# Patient Record
Sex: Male | Born: 1960 | Race: White | Hispanic: No | Marital: Married | Smoking: Never smoker
Health system: Southern US, Community
[De-identification: ages and names within clinical notes are randomized; demographics above are authoritative.]

## PROBLEM LIST (undated history)

## (undated) DIAGNOSIS — I6521 Occlusion and stenosis of right carotid artery: Secondary | ICD-10-CM

## (undated) DIAGNOSIS — Z973 Presence of spectacles and contact lenses: Secondary | ICD-10-CM

## (undated) DIAGNOSIS — E785 Hyperlipidemia, unspecified: Secondary | ICD-10-CM

## (undated) DIAGNOSIS — K219 Gastro-esophageal reflux disease without esophagitis: Secondary | ICD-10-CM

## (undated) DIAGNOSIS — C4491 Basal cell carcinoma of skin, unspecified: Secondary | ICD-10-CM

## (undated) DIAGNOSIS — I10 Essential (primary) hypertension: Secondary | ICD-10-CM

## (undated) DIAGNOSIS — I2510 Atherosclerotic heart disease of native coronary artery without angina pectoris: Secondary | ICD-10-CM

## (undated) HISTORY — DX: Atherosclerotic heart disease of native coronary artery without angina pectoris: I25.10

## (undated) HISTORY — DX: Basal cell carcinoma of skin, unspecified: C44.91

## (undated) HISTORY — DX: Gastro-esophageal reflux disease without esophagitis: K21.9

## (undated) HISTORY — PX: COLONOSCOPY: SHX174

## (undated) HISTORY — DX: Essential (primary) hypertension: I10

## (undated) HISTORY — DX: Hyperlipidemia, unspecified: E78.5

## (undated) HISTORY — PX: WISDOM TOOTH EXTRACTION: SHX21

---
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
## 1999-06-17 HISTORY — PX: KNEE ARTHROSCOPY: SUR90

## 2000-05-14 ENCOUNTER — Ambulatory Visit (HOSPITAL_BASED_OUTPATIENT_CLINIC_OR_DEPARTMENT_OTHER): Admission: RE | Admit: 2000-05-14 | Discharge: 2000-05-14 | Payer: Self-pay | Admitting: Orthopedic Surgery

## 2005-02-01 ENCOUNTER — Emergency Department (HOSPITAL_COMMUNITY): Admission: EM | Admit: 2005-02-01 | Discharge: 2005-02-01 | Payer: Self-pay | Admitting: Emergency Medicine

## 2009-06-05 ENCOUNTER — Ambulatory Visit (HOSPITAL_COMMUNITY): Admission: RE | Admit: 2009-06-05 | Discharge: 2009-06-05 | Payer: Self-pay | Admitting: Gastroenterology

## 2010-01-03 ENCOUNTER — Encounter: Admission: RE | Admit: 2010-01-03 | Discharge: 2010-01-03 | Payer: Self-pay | Admitting: Thoracic Surgery

## 2010-11-01 NOTE — Op Note (Signed)
[facility]  Patient:    Anthony Medina MDJaryd, Drew                     MRN: [id number] Proc. Date: 05/14/00 Adm. Date:  [id number] Disc. 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 Intra-articular meniscal cyst anterior aspect lateral meniscus.  OPERATION: 1. Left knee exam under anesthesia. 2. Arthroscopy with excision of lateral meniscal cyst. 3. Curettage of region of osteochondritis dissecans with pinning of    osteochondral fragment with bioabsorbable pins times four.  SURGEON:  Loreta Ave, M.D.  ASSISTANT:  Arlys John D. Petrarca, P.A.-C.  ANESTHESIA:  Local with IV sedation.  SPECIMENS:  None.  CULTURES:  None.  COMPLICATIONS:  None.  DRESSINGS:  Soft compressive.  DESCRIPTION OF PROCEDURE:  The patient was brought to the operating room and placed on the operating table in the supine position.  After adequate anesthesia had been obtained, the left knee examined.  Full motion and good stability.  Leg holder applied.  Leg prepped and draped in the usual sterile fashion.  Three portals created, one superolateral, one each medial and lateral parapatellar.  Inflow catheter introduced.  The knee distended.  The arthroscope introduced.  Knee inspected.  Articular cartilage intact except for the lateral compartment.  Good patella femoral tracking.  Medial meniscus without tears.  Cruciate ligament is intact.  Meniscal cyst off the anterior aspect of the lateral meniscus debrided out.  The roughening of the meniscus debrided as well.  Unstable osteochondral fragment half the tibial plateau. This had a partial cartilaginous connection but it was  acting as a trap door. This was elevated and the sclerotic bulb below was treated with multiple microfracturing to ensure better blood supply.  All debrided loose bodies of chondral material from below the fragment debrided.  Utilizing the three portals with the addition of another medial portal, parapatella and slightly superior the fragment was reduced back to an anatomic position and then fixed with four bioabsorbable Orthosorb 1.3 mm pins placed in divergent directions through the fragment into the tibia below.  Good fixation of the fragment itself up.  The pins were countered sunk.  Fragment was no longer mobile. Instruments and fluid removed.  The entire knee had been examined prior to this.  No other findings were appreciated.  The knee and portals were injected with Marcaine.  Portals were closed with nylon.  Sterile compressive dressing applied.  Anesthesia reversed.  He was brought to the recovery room.  He tolerated surgery well with no complications. DD:  05/18/00 TD:  05/19/00 Job: [id number]

## 2010-12-27 ENCOUNTER — Other Ambulatory Visit (HOSPITAL_COMMUNITY): Payer: Self-pay | Admitting: Neurological Surgery

## 2010-12-27 ENCOUNTER — Ambulatory Visit (HOSPITAL_COMMUNITY)
Admission: RE | Admit: 2010-12-27 | Discharge: 2010-12-27 | Disposition: A | Discharge status: Home / Self Care | Payer: Commercial Managed Care - PPO | Source: Ambulatory Visit | Attending: Neurological Surgery | Admitting: Neurological Surgery

## 2010-12-27 DIAGNOSIS — M542 Cervicalgia: Secondary | ICD-10-CM | POA: Insufficient documentation

## 2010-12-27 DIAGNOSIS — M5412 Radiculopathy, cervical region: Secondary | ICD-10-CM

## 2010-12-27 DIAGNOSIS — M5020 Other cervical disc displacement, unspecified cervical region: Secondary | ICD-10-CM | POA: Insufficient documentation

## 2010-12-27 DIAGNOSIS — R209 Unspecified disturbances of skin sensation: Secondary | ICD-10-CM | POA: Insufficient documentation

## 2010-12-27 DIAGNOSIS — M47812 Spondylosis without myelopathy or radiculopathy, cervical region: Secondary | ICD-10-CM | POA: Insufficient documentation

## 2012-10-20 ENCOUNTER — Other Ambulatory Visit: Payer: Self-pay | Admitting: Orthopedic Surgery

## 2012-10-20 DIAGNOSIS — M25551 Pain in right hip: Secondary | ICD-10-CM

## 2012-10-22 ENCOUNTER — Ambulatory Visit
Admission: RE | Admit: 2012-10-22 | Discharge: 2012-10-22 | Disposition: A | Discharge status: Home / Self Care | Payer: Commercial Managed Care - PPO | Source: Ambulatory Visit | Attending: Orthopedic Surgery | Admitting: Orthopedic Surgery

## 2012-10-22 DIAGNOSIS — M25551 Pain in right hip: Secondary | ICD-10-CM

## 2012-10-22 MED ORDER — IOHEXOL 180 MG/ML  SOLN
1.0000 mL | Freq: Once | INTRAMUSCULAR | Status: AC | PRN
  Administered 2012-10-22: 1 mL via INTRA_ARTICULAR

## 2012-10-22 MED ORDER — METHYLPREDNISOLONE ACETATE 40 MG/ML INJ SUSP (RADIOLOG
120.0000 mg | Freq: Once | INTRAMUSCULAR | Status: AC
  Administered 2012-10-22: 120 mg via INTRA_ARTICULAR

## 2013-02-08 ENCOUNTER — Telehealth: Payer: Self-pay | Admitting: Licensed Clinical Social Worker

## 2013-02-08 NOTE — Telephone Encounter (Signed)
I called and asked patient's wife to call back and schedule a lab appointment for blood cultures per Dr. Daiva Eves. 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

## 2013-03-09 ENCOUNTER — Ambulatory Visit
Admission: RE | Admit: 2013-03-09 | Discharge: 2013-03-09 | Disposition: A | Discharge status: Home / Self Care | Payer: Commercial Managed Care - PPO | Source: Ambulatory Visit | Attending: Internal Medicine | Admitting: Internal Medicine

## 2013-03-09 ENCOUNTER — Other Ambulatory Visit: Payer: Self-pay | Admitting: Internal Medicine

## 2013-03-09 DIAGNOSIS — Z01818 Encounter for other preprocedural examination: Secondary | ICD-10-CM

## 2013-04-27 ENCOUNTER — Encounter (HOSPITAL_COMMUNITY): Admission: RE | Payer: Self-pay | Source: Ambulatory Visit

## 2013-04-27 ENCOUNTER — Inpatient Hospital Stay (HOSPITAL_COMMUNITY)
Admission: RE | Admit: 2013-04-27 | Payer: Commercial Managed Care - PPO | Source: Ambulatory Visit | Admitting: Orthopedic Surgery

## 2013-04-27 SURGERY — ARTHROPLASTY, HIP, TOTAL, ANTERIOR APPROACH
Anesthesia: Choice | Laterality: Right

## 2013-05-08 HISTORY — PX: HIP RESURFACING: SHX1760

## 2013-06-07 ENCOUNTER — Other Ambulatory Visit: Payer: Self-pay | Admitting: Orthopedic Surgery

## 2013-06-07 ENCOUNTER — Ambulatory Visit
Admission: RE | Admit: 2013-06-07 | Discharge: 2013-06-07 | Disposition: A | Discharge status: Home / Self Care | Payer: 59 | Source: Ambulatory Visit | Attending: Orthopedic Surgery | Admitting: Orthopedic Surgery

## 2013-06-07 DIAGNOSIS — M1610 Unilateral primary osteoarthritis, unspecified hip: Secondary | ICD-10-CM

## 2013-06-07 DIAGNOSIS — M169 Osteoarthritis of hip, unspecified: Secondary | ICD-10-CM

## 2014-03-08 ENCOUNTER — Other Ambulatory Visit: Payer: Self-pay | Admitting: Internal Medicine

## 2014-03-08 DIAGNOSIS — R079 Chest pain, unspecified: Secondary | ICD-10-CM

## 2014-03-09 ENCOUNTER — Ambulatory Visit
Admission: RE | Admit: 2014-03-09 | Discharge: 2014-03-09 | Disposition: A | Discharge status: Home / Self Care | Payer: 59 | Source: Ambulatory Visit | Attending: Internal Medicine | Admitting: Internal Medicine

## 2014-03-09 DIAGNOSIS — R079 Chest pain, unspecified: Secondary | ICD-10-CM

## 2014-03-31 ENCOUNTER — Ambulatory Visit
Admission: RE | Admit: 2014-03-31 | Discharge: 2014-03-31 | Disposition: A | Discharge status: Home / Self Care | Payer: 59 | Source: Ambulatory Visit | Attending: Orthopedic Surgery | Admitting: Orthopedic Surgery

## 2014-03-31 ENCOUNTER — Other Ambulatory Visit: Payer: Self-pay | Admitting: Orthopedic Surgery

## 2014-03-31 DIAGNOSIS — M25551 Pain in right hip: Secondary | ICD-10-CM

## 2015-05-11 ENCOUNTER — Telehealth: Payer: Self-pay | Admitting: Cardiothoracic Surgery

## 2015-05-11 MED ORDER — AMOXICILLIN 500 MG PO CAPS: 500.0000 mg | ORAL_CAPSULE | Freq: Once | ORAL | Status: DC

## 2015-05-11 NOTE — Telephone Encounter (Signed)
Patient well known to me with implanted  prosthetic material needing amoxicillin for dental appointment, Dental office unable to fill. amoxicillin 500 mg one hour before dental appointment sent to pharmacy Grace Isaac MD      [address] [place] [address] Office [phone]   [place]

## 2015-07-03 DIAGNOSIS — S81851A Open bite, right lower leg, initial encounter: Secondary | ICD-10-CM | POA: Diagnosis not present

## 2015-07-03 DIAGNOSIS — W540XXA Bitten by dog, initial encounter: Secondary | ICD-10-CM | POA: Diagnosis not present

## 2015-07-03 DIAGNOSIS — Z23 Encounter for immunization: Secondary | ICD-10-CM | POA: Diagnosis not present

## 2015-07-09 DIAGNOSIS — E538 Deficiency of other specified B group vitamins: Secondary | ICD-10-CM | POA: Diagnosis not present

## 2016-01-09 DIAGNOSIS — F4320 Adjustment disorder, unspecified: Secondary | ICD-10-CM | POA: Diagnosis not present

## 2016-03-19 DIAGNOSIS — F4320 Adjustment disorder, unspecified: Secondary | ICD-10-CM | POA: Diagnosis not present

## 2016-05-12 DIAGNOSIS — E538 Deficiency of other specified B group vitamins: Secondary | ICD-10-CM | POA: Diagnosis not present

## 2016-05-12 DIAGNOSIS — E7800 Pure hypercholesterolemia, unspecified: Secondary | ICD-10-CM | POA: Diagnosis not present

## 2016-05-12 DIAGNOSIS — Z0000 Encounter for general adult medical examination without abnormal findings: Secondary | ICD-10-CM | POA: Diagnosis not present

## 2016-05-12 DIAGNOSIS — K219 Gastro-esophageal reflux disease without esophagitis: Secondary | ICD-10-CM | POA: Diagnosis not present

## 2016-05-12 DIAGNOSIS — R030 Elevated blood-pressure reading, without diagnosis of hypertension: Secondary | ICD-10-CM | POA: Diagnosis not present

## 2016-07-21 DIAGNOSIS — Z85828 Personal history of other malignant neoplasm of skin: Secondary | ICD-10-CM | POA: Diagnosis not present

## 2016-07-21 DIAGNOSIS — D2272 Melanocytic nevi of left lower limb, including hip: Secondary | ICD-10-CM | POA: Diagnosis not present

## 2016-07-21 DIAGNOSIS — L814 Other melanin hyperpigmentation: Secondary | ICD-10-CM | POA: Diagnosis not present

## 2016-07-21 DIAGNOSIS — D1801 Hemangioma of skin and subcutaneous tissue: Secondary | ICD-10-CM | POA: Diagnosis not present

## 2016-07-21 DIAGNOSIS — D2271 Melanocytic nevi of right lower limb, including hip: Secondary | ICD-10-CM | POA: Diagnosis not present

## 2016-07-21 DIAGNOSIS — D225 Melanocytic nevi of trunk: Secondary | ICD-10-CM | POA: Diagnosis not present

## 2016-08-21 DIAGNOSIS — I10 Essential (primary) hypertension: Secondary | ICD-10-CM | POA: Diagnosis not present

## 2016-12-12 DIAGNOSIS — H52223 Regular astigmatism, bilateral: Secondary | ICD-10-CM | POA: Diagnosis not present

## 2016-12-12 DIAGNOSIS — H524 Presbyopia: Secondary | ICD-10-CM | POA: Diagnosis not present

## 2017-04-02 ENCOUNTER — Ambulatory Visit
Admission: RE | Admit: 2017-04-02 | Discharge: 2017-04-02 | Disposition: A | Discharge status: Home / Self Care | Payer: 59 | Source: Ambulatory Visit | Attending: Orthopedic Surgery | Admitting: Orthopedic Surgery

## 2017-04-02 ENCOUNTER — Other Ambulatory Visit: Payer: Self-pay | Admitting: Orthopedic Surgery

## 2017-04-02 DIAGNOSIS — Z96641 Presence of right artificial hip joint: Secondary | ICD-10-CM | POA: Diagnosis not present

## 2017-04-02 DIAGNOSIS — R52 Pain, unspecified: Secondary | ICD-10-CM

## 2017-04-02 DIAGNOSIS — Z471 Aftercare following joint replacement surgery: Secondary | ICD-10-CM | POA: Diagnosis not present

## 2017-05-18 DIAGNOSIS — I10 Essential (primary) hypertension: Secondary | ICD-10-CM | POA: Diagnosis not present

## 2017-05-18 DIAGNOSIS — Z125 Encounter for screening for malignant neoplasm of prostate: Secondary | ICD-10-CM | POA: Diagnosis not present

## 2017-05-18 DIAGNOSIS — Z0000 Encounter for general adult medical examination without abnormal findings: Secondary | ICD-10-CM | POA: Diagnosis not present

## 2017-05-18 DIAGNOSIS — E538 Deficiency of other specified B group vitamins: Secondary | ICD-10-CM | POA: Diagnosis not present

## 2017-05-18 DIAGNOSIS — Z96641 Presence of right artificial hip joint: Secondary | ICD-10-CM | POA: Diagnosis not present

## 2017-05-18 DIAGNOSIS — K219 Gastro-esophageal reflux disease without esophagitis: Secondary | ICD-10-CM | POA: Diagnosis not present

## 2017-05-18 DIAGNOSIS — E7800 Pure hypercholesterolemia, unspecified: Secondary | ICD-10-CM | POA: Diagnosis not present

## 2017-05-19 DIAGNOSIS — I10 Essential (primary) hypertension: Secondary | ICD-10-CM | POA: Diagnosis not present

## 2017-08-28 DIAGNOSIS — Z85828 Personal history of other malignant neoplasm of skin: Secondary | ICD-10-CM | POA: Diagnosis not present

## 2017-08-28 DIAGNOSIS — B353 Tinea pedis: Secondary | ICD-10-CM | POA: Diagnosis not present

## 2017-08-28 DIAGNOSIS — D485 Neoplasm of uncertain behavior of skin: Secondary | ICD-10-CM | POA: Diagnosis not present

## 2017-08-28 DIAGNOSIS — L814 Other melanin hyperpigmentation: Secondary | ICD-10-CM | POA: Diagnosis not present

## 2017-08-28 DIAGNOSIS — D2261 Melanocytic nevi of right upper limb, including shoulder: Secondary | ICD-10-CM | POA: Diagnosis not present

## 2017-08-28 DIAGNOSIS — D1801 Hemangioma of skin and subcutaneous tissue: Secondary | ICD-10-CM | POA: Diagnosis not present

## 2017-08-28 DIAGNOSIS — C44519 Basal cell carcinoma of skin of other part of trunk: Secondary | ICD-10-CM | POA: Diagnosis not present

## 2017-08-28 DIAGNOSIS — L570 Actinic keratosis: Secondary | ICD-10-CM | POA: Diagnosis not present

## 2017-08-28 DIAGNOSIS — D225 Melanocytic nevi of trunk: Secondary | ICD-10-CM | POA: Diagnosis not present

## 2017-08-28 DIAGNOSIS — D2272 Melanocytic nevi of left lower limb, including hip: Secondary | ICD-10-CM | POA: Diagnosis not present

## 2017-12-25 DIAGNOSIS — H524 Presbyopia: Secondary | ICD-10-CM | POA: Diagnosis not present

## 2017-12-25 DIAGNOSIS — H52223 Regular astigmatism, bilateral: Secondary | ICD-10-CM | POA: Diagnosis not present

## 2018-05-31 DIAGNOSIS — E7800 Pure hypercholesterolemia, unspecified: Secondary | ICD-10-CM | POA: Diagnosis not present

## 2018-05-31 DIAGNOSIS — G453 Amaurosis fugax: Secondary | ICD-10-CM | POA: Diagnosis not present

## 2018-05-31 DIAGNOSIS — I7300 Raynaud's syndrome without gangrene: Secondary | ICD-10-CM | POA: Diagnosis not present

## 2018-05-31 DIAGNOSIS — Z0000 Encounter for general adult medical examination without abnormal findings: Secondary | ICD-10-CM | POA: Diagnosis not present

## 2018-05-31 DIAGNOSIS — K219 Gastro-esophageal reflux disease without esophagitis: Secondary | ICD-10-CM | POA: Diagnosis not present

## 2018-05-31 DIAGNOSIS — I10 Essential (primary) hypertension: Secondary | ICD-10-CM | POA: Diagnosis not present

## 2018-06-04 ENCOUNTER — Other Ambulatory Visit: Payer: Self-pay | Admitting: Vascular Surgery

## 2018-06-04 ENCOUNTER — Ambulatory Visit (HOSPITAL_COMMUNITY)
Admission: RE | Admit: 2018-06-04 | Discharge: 2018-06-04 | Disposition: A | Discharge status: Home / Self Care | Payer: 59 | Source: Ambulatory Visit | Attending: Family | Admitting: Family

## 2018-06-04 ENCOUNTER — Other Ambulatory Visit: Payer: Self-pay

## 2018-06-04 DIAGNOSIS — G453 Amaurosis fugax: Secondary | ICD-10-CM | POA: Diagnosis not present

## 2018-06-04 DIAGNOSIS — Z23 Encounter for immunization: Secondary | ICD-10-CM | POA: Diagnosis not present

## 2018-06-08 ENCOUNTER — Other Ambulatory Visit: Payer: Self-pay | Admitting: Internal Medicine

## 2018-06-08 DIAGNOSIS — I6521 Occlusion and stenosis of right carotid artery: Secondary | ICD-10-CM

## 2018-06-11 ENCOUNTER — Encounter: Payer: Self-pay | Admitting: Thoracic Surgery (Cardiothoracic Vascular Surgery)

## 2018-06-11 ENCOUNTER — Ambulatory Visit
Admission: RE | Admit: 2018-06-11 | Discharge: 2018-06-11 | Disposition: A | Discharge status: Home / Self Care | Payer: 59 | Source: Ambulatory Visit | Attending: Internal Medicine | Admitting: Internal Medicine

## 2018-06-11 DIAGNOSIS — I6521 Occlusion and stenosis of right carotid artery: Secondary | ICD-10-CM

## 2018-06-11 DIAGNOSIS — I6523 Occlusion and stenosis of bilateral carotid arteries: Secondary | ICD-10-CM | POA: Diagnosis not present

## 2018-06-11 MED ORDER — IOPAMIDOL (ISOVUE-370) INJECTION 76%
75.0000 mL | Freq: Once | INTRAVENOUS | Status: AC | PRN
  Administered 2018-06-11: 75 mL via INTRAVENOUS

## 2018-06-14 ENCOUNTER — Encounter: Payer: Self-pay | Admitting: Thoracic Surgery (Cardiothoracic Vascular Surgery)

## 2018-06-15 ENCOUNTER — Encounter: Payer: Self-pay | Admitting: Vascular Surgery

## 2018-06-15 ENCOUNTER — Ambulatory Visit (INDEPENDENT_AMBULATORY_CARE_PROVIDER_SITE_OTHER): Payer: 59 | Admitting: Vascular Surgery

## 2018-06-15 ENCOUNTER — Other Ambulatory Visit: Payer: Self-pay

## 2018-06-15 ENCOUNTER — Encounter (HOSPITAL_COMMUNITY): Payer: Self-pay

## 2018-06-15 ENCOUNTER — Encounter (HOSPITAL_COMMUNITY)
Admission: RE | Admit: 2018-06-15 | Discharge: 2018-06-15 | Disposition: A | Discharge status: Home / Self Care | Payer: 59 | Source: Ambulatory Visit | Attending: Vascular Surgery | Admitting: Vascular Surgery

## 2018-06-15 ENCOUNTER — Other Ambulatory Visit: Payer: Self-pay | Admitting: *Deleted

## 2018-06-15 VITALS — BP 136/84 | HR 54 | Resp 18 | Ht 69.0 in | Wt 171.0 lb

## 2018-06-15 DIAGNOSIS — G453 Amaurosis fugax: Secondary | ICD-10-CM | POA: Diagnosis not present

## 2018-06-15 DIAGNOSIS — K219 Gastro-esophageal reflux disease without esophagitis: Secondary | ICD-10-CM | POA: Diagnosis not present

## 2018-06-15 DIAGNOSIS — E7800 Pure hypercholesterolemia, unspecified: Secondary | ICD-10-CM

## 2018-06-15 DIAGNOSIS — E785 Hyperlipidemia, unspecified: Secondary | ICD-10-CM | POA: Diagnosis not present

## 2018-06-15 DIAGNOSIS — I10 Essential (primary) hypertension: Secondary | ICD-10-CM | POA: Insufficient documentation

## 2018-06-15 DIAGNOSIS — I6521 Occlusion and stenosis of right carotid artery: Secondary | ICD-10-CM

## 2018-06-15 DIAGNOSIS — Z7982 Long term (current) use of aspirin: Secondary | ICD-10-CM | POA: Diagnosis not present

## 2018-06-15 DIAGNOSIS — Z79899 Other long term (current) drug therapy: Secondary | ICD-10-CM | POA: Diagnosis not present

## 2018-06-15 DIAGNOSIS — R9431 Abnormal electrocardiogram [ECG] [EKG]: Secondary | ICD-10-CM

## 2018-06-15 DIAGNOSIS — R001 Bradycardia, unspecified: Secondary | ICD-10-CM | POA: Diagnosis not present

## 2018-06-15 DIAGNOSIS — Z01818 Encounter for other preprocedural examination: Secondary | ICD-10-CM | POA: Insufficient documentation

## 2018-06-15 DIAGNOSIS — Z85828 Personal history of other malignant neoplasm of skin: Secondary | ICD-10-CM | POA: Diagnosis not present

## 2018-06-15 HISTORY — DX: Presence of spectacles and contact lenses: Z97.3

## 2018-06-15 HISTORY — DX: Occlusion and stenosis of right carotid artery: I65.21

## 2018-06-15 LAB — COMPREHENSIVE METABOLIC PANEL
ALT: 31 U/L (ref 0–44)
AST: 41 U/L (ref 15–41)
Albumin: 3.8 g/dL (ref 3.5–5.0)
Alkaline Phosphatase: 28 U/L — ABNORMAL LOW (ref 38–126)
Anion gap: 9 (ref 5–15)
BUN: 16 mg/dL (ref 6–20)
CO2: 29 mmol/L (ref 22–32)
CREATININE: 1.13 mg/dL (ref 0.61–1.24)
Calcium: 9.1 mg/dL (ref 8.9–10.3)
Chloride: 101 mmol/L (ref 98–111)
GFR calc Af Amer: >60 mL/min (ref >60)
GFR calc non Af Amer: >60 mL/min (ref >60)
Glucose, Bld: 94 mg/dL (ref 70–99)
Potassium: 4.2 mmol/L (ref 3.5–5.1)
SODIUM: 139 mmol/L (ref 135–145)
Total Bilirubin: 0.9 mg/dL (ref 0.3–1.2)
Total Protein: 6.4 g/dL — ABNORMAL LOW (ref 6.5–8.1)

## 2018-06-15 LAB — URINALYSIS, ROUTINE W REFLEX MICROSCOPIC
Bilirubin Urine: NEGATIVE
Glucose, UA: NEGATIVE mg/dL
Hgb urine dipstick: NEGATIVE
Ketones, ur: NEGATIVE mg/dL
Leukocytes, UA: NEGATIVE
Nitrite: NEGATIVE
Protein, ur: NEGATIVE mg/dL
SPECIFIC GRAVITY, URINE: 1.009 (ref 1.005–1.030)
pH: 7 (ref 5.0–8.0)

## 2018-06-15 LAB — APTT: aPTT: 26 seconds (ref 24–36)

## 2018-06-15 LAB — CBC
HCT: 42.2 % (ref 39.0–52.0)
Hemoglobin: 13.6 g/dL (ref 13.0–17.0)
MCH: 32.5 pg (ref 26.0–34.0)
MCHC: 32.2 g/dL (ref 30.0–36.0)
MCV: 101 fL — ABNORMAL HIGH (ref 80.0–100.0)
Platelets: 201 10*3/uL (ref 150–400)
RBC: 4.18 MIL/uL — ABNORMAL LOW (ref 4.22–5.81)
RDW: 12.4 % (ref 11.5–15.5)
WBC: 4.2 10*3/uL (ref 4.0–10.5)
nRBC: 0 % (ref 0.0–0.2)

## 2018-06-15 LAB — PROTIME-INR
INR: 1
Prothrombin Time: 13.1 seconds (ref 11.4–15.2)

## 2018-06-15 LAB — ABO/RH: ABO/RH(D): O NEG

## 2018-06-15 LAB — SURGICAL PCR SCREEN
MRSA, PCR: NEGATIVE
Staphylococcus aureus: NEGATIVE

## 2018-06-15 MED ORDER — ROSUVASTATIN CALCIUM 40 MG PO TABS: 40.0000 mg | ORAL_TABLET | Freq: Every day | ORAL | 3 refills | Status: DC

## 2018-06-15 NOTE — H&P (View-Only) (Signed)
[facility] [place]  Patient name: Anthony Medina MRN: [id number] DOB: 1960-06-23 Sex: male  REASON FOR CONSULT: Evaluation of symptomatic right carotid stenosis  HPI: Anthony Medina is a 57 y.o. male, who is seen today for discussion of symptomatic right carotid stenosis.  He is here today with his wife.  He had 2 separate episodes of classic right eye amaurosis fugax 5 to 6 months ago.  He initiated aspirin therapy and has had no further episodes since then.  He specifically denies any prior episodes of a aphasia or focal neurologic deficit.  He has medical treatment for hypertension and hyperlipidemia but otherwise is in excellent health.  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  Outpatient carotid duplex revealed critical stenosis in his right internal carotid artery and he subsequently underwent CT angiogram for further evaluation  Past Medical History:  Diagnosis Date  . Cancer ([place])    basal cell carcinoma; skin  . Carotid stenosis, right   . Essential hypertension   . GERD (gastroesophageal reflux disease)   . Hyperlipidemia   . Wears glasses     Family History  Problem Relation Age of Onset  . Colon cancer Mother 41  . Alzheimer's disease Father 41  . Colon cancer Sister 70    SOCIAL HISTORY: Social History   Socioeconomic History  . Marital status: Married    Spouse name: Not on file  . Number of children: Not on file  . Years of education: Not on file  . Highest education level: Not on file  Occupational History  . Occupation: physician    Employer: [employer]: [employer]  Social Needs  . Financial resource strain: Not on file  . Food insecurity:    Worry: Not on file    Inability: Not on file  . Transportation needs:    Medical: Not on file    Non-medical: Not on file  Tobacco Use  . Smoking status: Never Smoker  . Smokeless tobacco: Never Used    Substance and Sexual Activity  . Alcohol use: Yes    Alcohol/week: 7.0 standard drinks    Types: 2 Glasses of wine, 5 Cans of beer per week    Comment:  1-2 daily  . Drug use: Never  . Sexual activity: Yes    Partners: Female    Birth control/protection: None  Lifestyle  . Physical activity:    Days per week: 6 days    Minutes per session: 90 min  . Stress: Not on file  Relationships  . Social connections:    Talks on phone: Not on file    Gets together: Not on file    Attends religious service: Not on file    Active member of club or organization: Not on file    Attends meetings of clubs or organizations: Not on file    Relationship status: Not on file  . Intimate partner violence:    Fear of current or ex partner: Not on file    Emotionally abused: Not on file    Physically abused: Not on file    Forced sexual activity: Not on file  Other Topics Concern  . Not on file  Social History Narrative  . Not on file    No Known Allergies  Current Outpatient Medications  Medication Sig Dispense Refill  . lisinopril (PRINIVIL,ZESTRIL) 10 MG tablet Take 10 mg by mouth daily.    Marland Kitchen  pantoprazole (PROTONIX) 40 MG tablet Take 40 mg by mouth daily.    . rosuvastatin (CRESTOR) 40 MG tablet Take 1 tablet (40 mg total) by mouth daily. 90 tablet 3  . sildenafil (VIAGRA) 50 MG tablet Take 50 mg by mouth daily as needed for erectile dysfunction.    . vitamin B-12 (CYANOCOBALAMIN) 1000 MCG tablet Take 1,000 mcg by mouth every 7 (seven) days.     Marland Kitchen acetaminophen (TYLENOL) 500 MG tablet Take 500-1,000 mg by mouth every 6 (six) hours as needed for headache.    Marland Kitchen aspirin 325 MG EC tablet Take 325 mg by mouth daily.    . naproxen sodium (ALEVE) 220 MG tablet Take 220-440 mg by mouth 2 (two) times daily as needed (mild pain).     No current facility-administered medications for this visit.     REVIEW OF SYSTEMS:  [X]  denotes positive finding, [ ]  denotes negative finding Cardiac  Comments:   Chest pain or chest pressure:    Shortness of breath upon exertion:    Short of breath when lying flat:    Irregular heart rhythm:        Vascular    Pain in calf, thigh, or hip brought on by ambulation:    Pain in feet at night that wakes you up from your sleep:     Blood clot in your veins:    Leg swelling:         Pulmonary    Oxygen at home:    Productive cough:     Wheezing:         Neurologic    Sudden weakness in arms or legs:     Sudden numbness in arms or legs:     Sudden onset of difficulty speaking or slurred speech:    Temporary loss of vision in one eye:  x   Problems with dizziness:         Gastrointestinal    Blood in stool:     Vomited blood:         Genitourinary    Burning when urinating:     Blood in urine:        Psychiatric    Major depression:         Hematologic    Bleeding problems:    Problems with blood clotting too easily:        Skin    Rashes or ulcers:        Constitutional    Fever or chills:      PHYSICAL EXAM: Vitals:   06/15/18 0821 06/15/18 0823  BP: 133/84 136/84  Pulse: (!) 54   Resp: 18   SpO2: 98%   Weight: 171 lb (77.6 kg)   Height: 5\' 9"  (1.753 m)     GENERAL: The patient is a well-nourished male, in no acute distress. The vital signs are documented above. CARDIOVASCULAR: Carotid arteries without bruits bilaterally.  2+ radial and 2+ dorsalis pedis pulses PULMONARY: There is good air exchange  ABDOMEN: Soft and non-tender  MUSCULOSKELETAL: There are no major deformities or cyanosis. NEUROLOGIC: No focal weakness or paresthesias are detected. SKIN: There are no ulcers or rashes noted. PSYCHIATRIC: The patient has a normal affect.  DATA:  Carotid duplex and CT angiogram were reviewed. CT angiogram reveals string sign in the right internal carotid artery with calcification and mixed plaque present.  There is calcification with no stenosis in the left internal carotid artery.  The right internal carotid artery  is  smaller than the left.  There is minimal atherosclerotic calcification in the aortic arch.  Does have mildly narrowing calcification at the origin of the left vertebral artery  MEDICAL ISSUES: Critical right carotid stenosis with 2 episodes of amaurosis fugax right eye.  Have recommended right carotid endarterectomy for reduction of stroke risk.  I explained the procedure in detail including the 1 to 2% risk of stroke the procedure.  Also the very slight risk of cranial nerve injury.  In the expected 1 night hospitalization.  We have scheduled surgery for 06/17/2018.   Rosetta Posner, MD FACS [facility] Tel [phone] Pager [phone]

## 2018-06-15 NOTE — Progress Notes (Signed)
Pt denies SOB and chest pain. Pt stated that he " sees Dr. Stanford Breed, " Cardiology. Pt denies having an EKG and chest x ray within the last year. Pt denies recent labs. Pt stated that his heart rate of 49 ( on EKG) is a " normal resting pulse."  Pt denies having an echo and cardiac cath but stated that a stress test was performed " 15 years ago. " Pt verbalized understanding of all pre-op instructions. Spoke with Karoline Caldwell, PA, Anesthesiology, to make aware of pt pulse rate of 49; no new orders.

## 2018-06-15 NOTE — Pre-Procedure Instructions (Signed)
   Anthony Medina  06/15/2018     [id number] [place], [place] - [place] [address] [place] [address] Phone: [phone] Fax: [phone]  [place], [place] - [address] [place] [address] Phone: [phone] Fax: [phone]   Your procedure is scheduled on Thursday, June 17, 2018  Report to [place] at 5:30 A.M.  Call this number if you have problems the morning of surgery:  [phone]   Remember:  Do not eat or drink after midnight Wednesday, June 16, 2018  Take these medicines the morning of surgery with A SIP OF WATER : fexofenadine (ALLEGRA), pantoprazole (PROTONIX), rosuvastatin (CRESTOR), fluticasone (FLONASE) 50 MCG/ACT nasal spray Stop taking vitamins, fish oil and herbal medications. Do not take any NSAIDs ie: Ibuprofen, Advil, Naproxen (Aleve), Motrin, BC and Goody Powder; stop now.   Do not wear jewelry, make-up or nail polish.  Do not wear lotions, powders, or perfumes, or deodorant.  Do not shave 48 hours prior to surgery.  Men may shave face and neck.  Do not bring valuables to the hospital.  [facility] is not responsible for any belongings or valuables.  Contacts, dentures or bridgework may not be worn into surgery.  Leave your suitcase in the car.  After surgery it may be brought to your room. Special instructions: See [facility] -Preparing for Surgery " sheet Please read over the following fact sheets that you were given. Pain Booklet, Coughing and Deep Breathing, MRSA Information and Surgical Site Infection Prevention

## 2018-06-15 NOTE — Progress Notes (Signed)
[facility] [place]  Patient name: Anthony Medina MRN: [id number] DOB: [birth year] Sex: male  REASON FOR CONSULT: Evaluation of symptomatic right carotid stenosis  HPI: Anthony Medina is a 57 y.o. male, who is seen today for discussion of symptomatic right carotid stenosis.  He is here today with his wife.  He had 2 separate episodes of classic right eye amaurosis fugax 5 to 6 months ago.  He initiated aspirin therapy and has had no further episodes since then.  He specifically denies any prior episodes of a aphasia or focal neurologic deficit.  He has medical treatment for hypertension and hyperlipidemia but otherwise is in excellent health.  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  Outpatient carotid duplex revealed critical stenosis in his right internal carotid artery and he subsequently underwent CT angiogram for further evaluation  Past Medical History:  Diagnosis Date  . Cancer ([place])    basal cell carcinoma; skin  . Carotid stenosis, right   . Essential hypertension   . GERD (gastroesophageal reflux disease)   . Hyperlipidemia   . Wears glasses     Family History  Problem Relation Age of Onset  . Colon cancer Mother 66  . Alzheimer's disease Father 48  . Colon cancer Sister 62    SOCIAL HISTORY: Social History   Socioeconomic History  . Marital status: Married    Spouse name: Not on file  . Number of children: Not on file  . Years of education: Not on file  . Highest education level: Not on file  Occupational History  . Occupation: physician    Employer: [employer]: [employer]  Social Needs  . Financial resource strain: Not on file  . Food insecurity:    Worry: Not on file    Inability: Not on file  . Transportation needs:    Medical: Not on file    Non-medical: Not on file  Tobacco Use  . Smoking status: Never Smoker  . Smokeless tobacco: Never Used    Substance and Sexual Activity  . Alcohol use: Yes    Alcohol/week: 7.0 standard drinks    Types: 2 Glasses of wine, 5 Cans of beer per week    Comment:  1-2 daily  . Drug use: Never  . Sexual activity: Yes    Partners: Female    Birth control/protection: None  Lifestyle  . Physical activity:    Days per week: 6 days    Minutes per session: 90 min  . Stress: Not on file  Relationships  . Social connections:    Talks on phone: Not on file    Gets together: Not on file    Attends religious service: Not on file    Active member of club or organization: Not on file    Attends meetings of clubs or organizations: Not on file    Relationship status: Not on file  . Intimate partner violence:    Fear of current or ex partner: Not on file    Emotionally abused: Not on file    Physically abused: Not on file    Forced sexual activity: Not on file  Other Topics Concern  . Not on file  Social History Narrative  . Not on file    No Known Allergies  Current Outpatient Medications  Medication Sig Dispense Refill  . lisinopril (PRINIVIL,ZESTRIL) 10 MG tablet Take 10 mg by mouth daily.    Marland Kitchen  pantoprazole (PROTONIX) 40 MG tablet Take 40 mg by mouth daily.    . rosuvastatin (CRESTOR) 40 MG tablet Take 1 tablet (40 mg total) by mouth daily. 90 tablet 3  . sildenafil (VIAGRA) 50 MG tablet Take 50 mg by mouth daily as needed for erectile dysfunction.    . vitamin B-12 (CYANOCOBALAMIN) 1000 MCG tablet Take 1,000 mcg by mouth every 7 (seven) days.     Marland Kitchen acetaminophen (TYLENOL) 500 MG tablet Take 500-1,000 mg by mouth every 6 (six) hours as needed for headache.    Marland Kitchen aspirin 325 MG EC tablet Take 325 mg by mouth daily.    . naproxen sodium (ALEVE) 220 MG tablet Take 220-440 mg by mouth 2 (two) times daily as needed (mild pain).     No current facility-administered medications for this visit.     REVIEW OF SYSTEMS:  [X]  denotes positive finding, [ ]  denotes negative finding Cardiac  Comments:   Chest pain or chest pressure:    Shortness of breath upon exertion:    Short of breath when lying flat:    Irregular heart rhythm:        Vascular    Pain in calf, thigh, or hip brought on by ambulation:    Pain in feet at night that wakes you up from your sleep:     Blood clot in your veins:    Leg swelling:         Pulmonary    Oxygen at home:    Productive cough:     Wheezing:         Neurologic    Sudden weakness in arms or legs:     Sudden numbness in arms or legs:     Sudden onset of difficulty speaking or slurred speech:    Temporary loss of vision in one eye:  x   Problems with dizziness:         Gastrointestinal    Blood in stool:     Vomited blood:         Genitourinary    Burning when urinating:     Blood in urine:        Psychiatric    Major depression:         Hematologic    Bleeding problems:    Problems with blood clotting too easily:        Skin    Rashes or ulcers:        Constitutional    Fever or chills:      PHYSICAL EXAM: Vitals:   06/15/18 0821 06/15/18 0823  BP: 133/84 136/84  Pulse: (!) 54   Resp: 18   SpO2: 98%   Weight: 171 lb (77.6 kg)   Height: 5\' 9"  (1.753 m)     GENERAL: The patient is a well-nourished male, in no acute distress. The vital signs are documented above. CARDIOVASCULAR: Carotid arteries without bruits bilaterally.  2+ radial and 2+ dorsalis pedis pulses PULMONARY: There is good air exchange  ABDOMEN: Soft and non-tender  MUSCULOSKELETAL: There are no major deformities or cyanosis. NEUROLOGIC: No focal weakness or paresthesias are detected. SKIN: There are no ulcers or rashes noted. PSYCHIATRIC: The patient has a normal affect.  DATA:  Carotid duplex and CT angiogram were reviewed. CT angiogram reveals string sign in the right internal carotid artery with calcification and mixed plaque present.  There is calcification with no stenosis in the left internal carotid artery.  The right internal carotid artery  is  smaller than the left.  There is minimal atherosclerotic calcification in the aortic arch.  Does have mildly narrowing calcification at the origin of the left vertebral artery  MEDICAL ISSUES: Critical right carotid stenosis with 2 episodes of amaurosis fugax right eye.  Have recommended right carotid endarterectomy for reduction of stroke risk.  I explained the procedure in detail including the 1 to 2% risk of stroke the procedure.  Also the very slight risk of cranial nerve injury.  In the expected 1 night hospitalization.  We have scheduled surgery for 06/17/2018.   Rosetta Posner, MD FACS [facility] Tel [phone] Pager [phone]

## 2018-06-15 NOTE — Pre-Procedure Instructions (Signed)
   Anthony Medina  06/15/2018     [id number] [place], [place] - [place] [address] [place] [address] Phone: [phone] Fax: [phone]  [place], [place] - [address] [place] [address] Phone: [phone] Fax: [phone]   Your procedure is scheduled on Thursday, June 17, 2018  Report to [place] at 5:30 A.M.  Call this number if you have problems the morning of surgery:  [phone]   Remember:  Do not eat or drink after midnight Wednesday, June 16, 2018  Take these medicines the morning of surgery with A SIP OF WATER : fexofenadine (ALLEGRA), pantoprazole (PROTONIX), fluticasone (FLONASE) 50 MCG/ACT nasal spray Stop taking vitamins, fish oil and herbal medications. Do not take any NSAIDs ie: Ibuprofen, Advil, Naproxen (Aleve), Motrin, BC and Goody Powder; stop now.   Do not wear jewelry, make-up or nail polish.  Do not wear lotions, powders, or perfumes, or deodorant.  Do not shave 48 hours prior to surgery.  Men may shave face and neck.  Do not bring valuables to the hospital.  [facility] is not responsible for any belongings or valuables.  Contacts, dentures or bridgework may not be worn into surgery.  Leave your suitcase in the car.  After surgery it may be brought to your room. Special instructions: See [facility] -Preparing for Surgery " sheet Please read over the following fact sheets that you were given. Pain Booklet, Coughing and Deep Breathing, MRSA Information and Surgical Site Infection Prevention

## 2018-06-15 NOTE — Progress Notes (Signed)
Pre-admission appointment today at 10 am. Arranged with Rolla Flatten. Patient to remain on ASA.

## 2018-06-16 ENCOUNTER — Encounter: Payer: Self-pay | Admitting: Thoracic Surgery (Cardiothoracic Vascular Surgery)

## 2018-06-16 DIAGNOSIS — I6521 Occlusion and stenosis of right carotid artery: Secondary | ICD-10-CM | POA: Diagnosis not present

## 2018-06-16 DIAGNOSIS — I10 Essential (primary) hypertension: Secondary | ICD-10-CM | POA: Diagnosis not present

## 2018-06-16 DIAGNOSIS — E785 Hyperlipidemia, unspecified: Secondary | ICD-10-CM | POA: Diagnosis not present

## 2018-06-16 DIAGNOSIS — K219 Gastro-esophageal reflux disease without esophagitis: Secondary | ICD-10-CM | POA: Diagnosis not present

## 2018-06-16 DIAGNOSIS — Z85828 Personal history of other malignant neoplasm of skin: Secondary | ICD-10-CM | POA: Diagnosis not present

## 2018-06-16 DIAGNOSIS — Z7982 Long term (current) use of aspirin: Secondary | ICD-10-CM | POA: Diagnosis not present

## 2018-06-16 DIAGNOSIS — R001 Bradycardia, unspecified: Secondary | ICD-10-CM | POA: Diagnosis not present

## 2018-06-16 DIAGNOSIS — Z79899 Other long term (current) drug therapy: Secondary | ICD-10-CM | POA: Diagnosis not present

## 2018-06-16 LAB — TYPE AND SCREEN
ABO/RH(D): O NEG
Antibody Screen: NEGATIVE

## 2018-06-16 NOTE — Anesthesia Preprocedure Evaluation (Addendum)
Anesthesia Evaluation  Patient identified by MRN, date of birth, ID band Patient awake    Reviewed: Allergy & Precautions, H&P , NPO status , Patient's Chart, lab work & pertinent test results  Airway Mallampati: II  TM Distance: >3 FB Neck ROM: Full    Dental no notable dental hx. (+) Teeth Intact, Dental Advisory Given   Pulmonary neg pulmonary ROS,    Pulmonary exam normal breath sounds clear to auscultation       Cardiovascular Exercise Tolerance: Good hypertension, Pt. on medications + Peripheral Vascular Disease   Rhythm:Regular Rate:Normal     Neuro/Psych negative neurological ROS  negative psych ROS   GI/Hepatic Neg liver ROS, GERD  Medicated and Controlled,  Endo/Other  negative endocrine ROS  Renal/GU negative Renal ROS  negative genitourinary   Musculoskeletal   Abdominal   Peds  Hematology negative hematology ROS (+)   Anesthesia Other Findings   Reproductive/Obstetrics negative OB ROS                            Anesthesia Physical Anesthesia Plan  ASA: II  Anesthesia Plan: General   Post-op Pain Management:    Induction: Intravenous  PONV Risk Score and Plan: 3 and Ondansetron, Dexamethasone and Midazolam  Airway Management Planned: Oral ETT  Additional Equipment: Arterial line  Intra-op Plan:   Post-operative Plan: Extubation in OR  Informed Consent: I have reviewed the patients History and Physical, chart, labs and discussed the procedure including the risks, benefits and alternatives for the proposed anesthesia with the patient or authorized representative who has indicated his/her understanding and acceptance.   Dental advisory given  Plan Discussed with: CRNA  Anesthesia Plan Comments:         Anesthesia Quick Evaluation

## 2018-06-17 ENCOUNTER — Encounter (HOSPITAL_COMMUNITY)
Admission: RE | Disposition: A | Discharge status: Home / Self Care | Payer: Self-pay | Source: Home / Self Care | Attending: Vascular Surgery

## 2018-06-17 ENCOUNTER — Other Ambulatory Visit: Payer: Self-pay

## 2018-06-17 ENCOUNTER — Telehealth: Payer: Self-pay | Admitting: Vascular Surgery

## 2018-06-17 ENCOUNTER — Encounter (HOSPITAL_COMMUNITY): Payer: Self-pay | Admitting: Critical Care Medicine

## 2018-06-17 ENCOUNTER — Inpatient Hospital Stay (HOSPITAL_COMMUNITY): Payer: 59 | Admitting: Physician Assistant

## 2018-06-17 ENCOUNTER — Inpatient Hospital Stay (HOSPITAL_COMMUNITY)
Admission: RE | Admit: 2018-06-17 | Discharge: 2018-06-18 | DRG: 039 | Disposition: A | Discharge status: Home / Self Care | Payer: 59 | Attending: Vascular Surgery | Admitting: Vascular Surgery

## 2018-06-17 ENCOUNTER — Inpatient Hospital Stay (HOSPITAL_COMMUNITY): Payer: 59 | Admitting: Anesthesiology

## 2018-06-17 DIAGNOSIS — K219 Gastro-esophageal reflux disease without esophagitis: Secondary | ICD-10-CM | POA: Diagnosis present

## 2018-06-17 DIAGNOSIS — I6529 Occlusion and stenosis of unspecified carotid artery: Secondary | ICD-10-CM | POA: Diagnosis present

## 2018-06-17 DIAGNOSIS — I6521 Occlusion and stenosis of right carotid artery: Principal | ICD-10-CM | POA: Diagnosis present

## 2018-06-17 DIAGNOSIS — I10 Essential (primary) hypertension: Secondary | ICD-10-CM | POA: Diagnosis present

## 2018-06-17 DIAGNOSIS — Z7982 Long term (current) use of aspirin: Secondary | ICD-10-CM | POA: Diagnosis not present

## 2018-06-17 DIAGNOSIS — Z79899 Other long term (current) drug therapy: Secondary | ICD-10-CM | POA: Diagnosis not present

## 2018-06-17 DIAGNOSIS — E785 Hyperlipidemia, unspecified: Secondary | ICD-10-CM | POA: Diagnosis present

## 2018-06-17 DIAGNOSIS — Z85828 Personal history of other malignant neoplasm of skin: Secondary | ICD-10-CM

## 2018-06-17 DIAGNOSIS — R001 Bradycardia, unspecified: Secondary | ICD-10-CM

## 2018-06-17 DIAGNOSIS — I739 Peripheral vascular disease, unspecified: Secondary | ICD-10-CM | POA: Diagnosis not present

## 2018-06-17 HISTORY — PX: ENDARTERECTOMY: SHX5162

## 2018-06-17 HISTORY — PX: PATCH ANGIOPLASTY: SHX6230

## 2018-06-17 HISTORY — PX: CAROTID ENDARTERECTOMY: SUR193

## 2018-06-17 LAB — CREATININE, SERUM: Creatinine, Ser: <0.3 mg/dL — ABNORMAL LOW (ref 0.61–1.24)

## 2018-06-17 LAB — CBC
HCT: 19 % — ABNORMAL LOW (ref 39.0–52.0)
Hemoglobin: 5.9 g/dL — CL (ref 13.0–17.0)
MCH: 31.9 pg (ref 26.0–34.0)
MCHC: 31.1 g/dL (ref 30.0–36.0)
MCV: 102.7 fL — ABNORMAL HIGH (ref 80.0–100.0)
NRBC: 0 % (ref 0.0–0.2)
RBC: 1.85 MIL/uL — ABNORMAL LOW (ref 4.22–5.81)
RDW: 12.6 % (ref 11.5–15.5)
WBC: 4 10*3/uL (ref 4.0–10.5)

## 2018-06-17 SURGERY — ENDARTERECTOMY, CAROTID
Anesthesia: General | Site: Neck | Laterality: Right

## 2018-06-17 MED ORDER — ASPIRIN EC 325 MG PO TBEC: 325.0000 mg | DELAYED_RELEASE_TABLET | Freq: Every day | ORAL | Status: DC

## 2018-06-17 MED ORDER — FENTANYL CITRATE (PF) 250 MCG/5ML IJ SOLN
INTRAMUSCULAR | Status: DC | PRN
  Administered 2018-06-17: 50 ug via INTRAVENOUS
  Administered 2018-06-17: 25 ug via INTRAVENOUS
  Administered 2018-06-17: 50 ug via INTRAVENOUS
  Administered 2018-06-17: 25 ug via INTRAVENOUS

## 2018-06-17 MED ORDER — SODIUM CHLORIDE 0.9 % IV SOLN
0.0125 ug/kg/min | INTRAVENOUS | Status: AC
  Administered 2018-06-17: .3 ug/kg/min via INTRAVENOUS
  Filled 2018-06-17: qty 2000

## 2018-06-17 MED ORDER — 0.9 % SODIUM CHLORIDE (POUR BTL) OPTIME
TOPICAL | Status: DC | PRN
  Administered 2018-06-17: 2000 mL

## 2018-06-17 MED ORDER — BISACODYL 5 MG PO TBEC: 5.0000 mg | DELAYED_RELEASE_TABLET | Freq: Every day | ORAL | Status: DC | PRN

## 2018-06-17 MED ORDER — SENNOSIDES-DOCUSATE SODIUM 8.6-50 MG PO TABS: 1.0000 | ORAL_TABLET | Freq: Every evening | ORAL | Status: DC | PRN

## 2018-06-17 MED ORDER — SODIUM CHLORIDE 0.9 % IV SOLN
500.0000 mL | Freq: Once | INTRAVENOUS | Status: AC | PRN
  Administered 2018-06-17: 500 mL via INTRAVENOUS

## 2018-06-17 MED ORDER — LACTATED RINGERS IV SOLN
INTRAVENOUS | Status: DC | PRN
  Administered 2018-06-17: 07:00:00 via INTRAVENOUS

## 2018-06-17 MED ORDER — DOCUSATE SODIUM 100 MG PO CAPS: 100.0000 mg | ORAL_CAPSULE | Freq: Every day | ORAL | Status: DC

## 2018-06-17 MED ORDER — PHENOL 1.4 % MT LIQD: 1.0000 | OROMUCOSAL | Status: DC | PRN

## 2018-06-17 MED ORDER — POTASSIUM CHLORIDE CRYS ER 20 MEQ PO TBCR: 20.0000 meq | EXTENDED_RELEASE_TABLET | Freq: Every day | ORAL | Status: DC | PRN

## 2018-06-17 MED ORDER — SUGAMMADEX SODIUM 200 MG/2ML IV SOLN
INTRAVENOUS | Status: DC | PRN
  Administered 2018-06-17: 160 mg via INTRAVENOUS

## 2018-06-17 MED ORDER — PROPOFOL 10 MG/ML IV BOLUS
INTRAVENOUS | Status: DC | PRN
  Administered 2018-06-17: 150 mg via INTRAVENOUS

## 2018-06-17 MED ORDER — SODIUM CHLORIDE 0.9 % IV SOLN: INTRAVENOUS | Status: DC

## 2018-06-17 MED ORDER — NAPROXEN SODIUM 220 MG PO TABS: 220.0000 mg | ORAL_TABLET | Freq: Two times a day (BID) | ORAL | Status: DC | PRN

## 2018-06-17 MED ORDER — LABETALOL HCL 5 MG/ML IV SOLN: 10.0000 mg | INTRAVENOUS | Status: DC | PRN

## 2018-06-17 MED ORDER — SODIUM CHLORIDE 0.9 % IV SOLN
INTRAVENOUS | Status: DC | PRN
  Administered 2018-06-17: 25 ug/min via INTRAVENOUS

## 2018-06-17 MED ORDER — CEFAZOLIN SODIUM-DEXTROSE 2-4 GM/100ML-% IV SOLN
2.0000 g | INTRAVENOUS | Status: AC
  Administered 2018-06-17: 2 g via INTRAVENOUS

## 2018-06-17 MED ORDER — MIDAZOLAM HCL 2 MG/2ML IJ SOLN
INTRAMUSCULAR | Status: AC
  Filled 2018-06-17: qty 2

## 2018-06-17 MED ORDER — ENOXAPARIN SODIUM 40 MG/0.4ML ~~LOC~~ SOLN: 40.0000 mg | SUBCUTANEOUS | Status: DC

## 2018-06-17 MED ORDER — ONDANSETRON HCL 4 MG/2ML IJ SOLN: 4.0000 mg | Freq: Four times a day (QID) | INTRAMUSCULAR | Status: DC | PRN

## 2018-06-17 MED ORDER — ALBUMIN HUMAN 5 % IV SOLN
INTRAVENOUS | Status: AC
  Filled 2018-06-17: qty 250

## 2018-06-17 MED ORDER — SODIUM CHLORIDE 0.9 % IV SOLN
INTRAVENOUS | Status: AC
  Filled 2018-06-17: qty 1.2

## 2018-06-17 MED ORDER — MAGNESIUM SULFATE 2 GM/50ML IV SOLN: 2.0000 g | Freq: Every day | INTRAVENOUS | Status: DC | PRN

## 2018-06-17 MED ORDER — CEFAZOLIN SODIUM-DEXTROSE 2-4 GM/100ML-% IV SOLN
2.0000 g | Freq: Three times a day (TID) | INTRAVENOUS | Status: AC
  Administered 2018-06-17 (×2): 2 g via INTRAVENOUS
  Filled 2018-06-17 (×2): qty 100

## 2018-06-17 MED ORDER — LIDOCAINE 2% (20 MG/ML) 5 ML SYRINGE
INTRAMUSCULAR | Status: DC | PRN
  Administered 2018-06-17: 60 mg via INTRAVENOUS

## 2018-06-17 MED ORDER — HYDRALAZINE HCL 20 MG/ML IJ SOLN: 5.0000 mg | INTRAMUSCULAR | Status: DC | PRN

## 2018-06-17 MED ORDER — SODIUM CHLORIDE 0.9 % IV SOLN
INTRAVENOUS | Status: DC | PRN
  Administered 2018-06-17: 500 mL

## 2018-06-17 MED ORDER — GLYCOPYRROLATE PF 0.2 MG/ML IJ SOSY
PREFILLED_SYRINGE | INTRAMUSCULAR | Status: DC | PRN
  Administered 2018-06-17 (×2): .1 mg via INTRAVENOUS

## 2018-06-17 MED ORDER — METOPROLOL TARTRATE 5 MG/5ML IV SOLN: 2.0000 mg | INTRAVENOUS | Status: DC | PRN

## 2018-06-17 MED ORDER — SODIUM CHLORIDE 0.9 % IV BOLUS
500.0000 mL | Freq: Once | INTRAVENOUS | Status: AC
  Administered 2018-06-17: 500 mL via INTRAVENOUS

## 2018-06-17 MED ORDER — FENTANYL CITRATE (PF) 250 MCG/5ML IJ SOLN
INTRAMUSCULAR | Status: AC
  Filled 2018-06-17: qty 5

## 2018-06-17 MED ORDER — HYDROMORPHONE HCL 1 MG/ML IJ SOLN
0.2500 mg | INTRAMUSCULAR | Status: DC | PRN
  Administered 2018-06-17: 0.25 mg via INTRAVENOUS

## 2018-06-17 MED ORDER — CEFAZOLIN SODIUM-DEXTROSE 2-4 GM/100ML-% IV SOLN
INTRAVENOUS | Status: AC
  Filled 2018-06-17: qty 100

## 2018-06-17 MED ORDER — ACETAMINOPHEN 325 MG PO TABS
325.0000 mg | ORAL_TABLET | ORAL | Status: DC | PRN
  Administered 2018-06-17 – 2018-06-18 (×3): 650 mg via ORAL
  Filled 2018-06-17 (×3): qty 2

## 2018-06-17 MED ORDER — PHENYLEPHRINE 40 MCG/ML (10ML) SYRINGE FOR IV PUSH (FOR BLOOD PRESSURE SUPPORT)
PREFILLED_SYRINGE | INTRAVENOUS | Status: DC | PRN
  Administered 2018-06-17 (×4): 40 ug via INTRAVENOUS

## 2018-06-17 MED ORDER — MORPHINE SULFATE (PF) 2 MG/ML IV SOLN: 1.0000 mg | INTRAVENOUS | Status: DC | PRN

## 2018-06-17 MED ORDER — ROSUVASTATIN CALCIUM 20 MG PO TABS
40.0000 mg | ORAL_TABLET | Freq: Every day | ORAL | Status: DC
  Administered 2018-06-17: 40 mg via ORAL
  Filled 2018-06-17: qty 2

## 2018-06-17 MED ORDER — ALBUMIN HUMAN 5 % IV SOLN
12.5000 g | Freq: Once | INTRAVENOUS | Status: AC
  Administered 2018-06-17: 12.5 g via INTRAVENOUS

## 2018-06-17 MED ORDER — ONDANSETRON HCL 4 MG/2ML IJ SOLN
INTRAMUSCULAR | Status: DC | PRN
  Administered 2018-06-17: 4 mg via INTRAVENOUS

## 2018-06-17 MED ORDER — LIDOCAINE HCL (PF) 1 % IJ SOLN
INTRAMUSCULAR | Status: AC
  Filled 2018-06-17: qty 30

## 2018-06-17 MED ORDER — HYDROMORPHONE HCL 1 MG/ML IJ SOLN
INTRAMUSCULAR | Status: AC
  Filled 2018-06-17: qty 1

## 2018-06-17 MED ORDER — LACTATED RINGERS IV SOLN
INTRAVENOUS | Status: DC | PRN
  Administered 2018-06-17: 08:00:00 via INTRAVENOUS

## 2018-06-17 MED ORDER — MIDAZOLAM HCL 5 MG/5ML IJ SOLN
INTRAMUSCULAR | Status: DC | PRN
  Administered 2018-06-17: 1 mg via INTRAVENOUS
  Administered 2018-06-17 (×2): 0.5 mg via INTRAVENOUS

## 2018-06-17 MED ORDER — PANTOPRAZOLE SODIUM 40 MG PO TBEC: 40.0000 mg | DELAYED_RELEASE_TABLET | Freq: Every day | ORAL | Status: DC

## 2018-06-17 MED ORDER — LISINOPRIL 10 MG PO TABS: 10.0000 mg | ORAL_TABLET | Freq: Every day | ORAL | Status: DC

## 2018-06-17 MED ORDER — OXYCODONE HCL 5 MG PO TABS
5.0000 mg | ORAL_TABLET | ORAL | Status: DC | PRN
  Administered 2018-06-17 – 2018-06-18 (×3): 5 mg via ORAL
  Filled 2018-06-17 (×2): qty 1

## 2018-06-17 MED ORDER — EPHEDRINE SULFATE-NACL 50-0.9 MG/10ML-% IV SOSY
PREFILLED_SYRINGE | INTRAVENOUS | Status: DC | PRN
  Administered 2018-06-17 (×2): 5 mg via INTRAVENOUS

## 2018-06-17 MED ORDER — DEXAMETHASONE SODIUM PHOSPHATE 10 MG/ML IJ SOLN
INTRAMUSCULAR | Status: DC | PRN
  Administered 2018-06-17: 10 mg via INTRAVENOUS

## 2018-06-17 MED ORDER — ALUM & MAG HYDROXIDE-SIMETH 200-200-20 MG/5ML PO SUSP: 15.0000 mL | ORAL | Status: DC | PRN

## 2018-06-17 MED ORDER — OXYCODONE HCL 5 MG PO TABS
ORAL_TABLET | ORAL | Status: AC
  Filled 2018-06-17: qty 1

## 2018-06-17 MED ORDER — GUAIFENESIN-DM 100-10 MG/5ML PO SYRP: 15.0000 mL | ORAL_SOLUTION | ORAL | Status: DC | PRN

## 2018-06-17 MED ORDER — PROPOFOL 10 MG/ML IV BOLUS
INTRAVENOUS | Status: AC
  Filled 2018-06-17: qty 20

## 2018-06-17 MED ORDER — CHLORHEXIDINE GLUCONATE 4 % EX LIQD: 60.0000 mL | Freq: Once | CUTANEOUS | Status: DC

## 2018-06-17 MED ORDER — HEPARIN SODIUM (PORCINE) 1000 UNIT/ML IJ SOLN
INTRAMUSCULAR | Status: DC | PRN
  Administered 2018-06-17: 8000 [IU] via INTRAVENOUS

## 2018-06-17 MED ORDER — ROCURONIUM BROMIDE 10 MG/ML (PF) SYRINGE
PREFILLED_SYRINGE | INTRAVENOUS | Status: DC | PRN
  Administered 2018-06-17: 50 mg via INTRAVENOUS

## 2018-06-17 MED ORDER — ACETAMINOPHEN 325 MG RE SUPP: 325.0000 mg | RECTAL | Status: DC | PRN

## 2018-06-17 MED ORDER — PROTAMINE SULFATE 10 MG/ML IV SOLN
INTRAVENOUS | Status: DC | PRN
  Administered 2018-06-17: 50 mg via INTRAVENOUS

## 2018-06-17 SURGICAL SUPPLY — 39 items
ADH SKN CLS APL DERMABOND .7 (GAUZE/BANDAGES/DRESSINGS) ×1
CANISTER SUCT 3000ML PPV (MISCELLANEOUS) ×2 IMPLANT
CANNULA VESSEL 3MM 2 BLNT TIP (CANNULA) ×4 IMPLANT
CATH ROBINSON RED A/P 18FR (CATHETERS) ×2 IMPLANT
CLIP LIGATING EXTRA MED SLVR (CLIP) ×2 IMPLANT
CLIP LIGATING EXTRA SM BLUE (MISCELLANEOUS) ×2 IMPLANT
COVER WAND RF STERILE (DRAPES) ×2 IMPLANT
CRADLE DONUT ADULT HEAD (MISCELLANEOUS) ×2 IMPLANT
DECANTER SPIKE VIAL GLASS SM (MISCELLANEOUS) IMPLANT
DERMABOND ADVANCED (GAUZE/BANDAGES/DRESSINGS) ×1
DERMABOND ADVANCED .7 DNX12 (GAUZE/BANDAGES/DRESSINGS) ×1 IMPLANT
DRAIN HEMOVAC 1/8 X 5 (WOUND CARE) IMPLANT
ELECT REM PT RETURN 9FT ADLT (ELECTROSURGICAL) ×2
ELECTRODE REM PT RTRN 9FT ADLT (ELECTROSURGICAL) ×1 IMPLANT
EVACUATOR SILICONE 100CC (DRAIN) IMPLANT
GLOVE SS BIOGEL STRL SZ 7.5 (GLOVE) ×1 IMPLANT
GLOVE SUPERSENSE BIOGEL SZ 7.5 (GLOVE) ×1
GOWN STRL REUS W/ TWL LRG LVL3 (GOWN DISPOSABLE) ×3 IMPLANT
GOWN STRL REUS W/TWL LRG LVL3 (GOWN DISPOSABLE) ×6
KIT BASIN OR (CUSTOM PROCEDURE TRAY) ×2 IMPLANT
KIT SHUNT ARGYLE CAROTID ART 6 (VASCULAR PRODUCTS) IMPLANT
KIT TURNOVER KIT B (KITS) ×2 IMPLANT
NEEDLE 22X1 1/2 (OR ONLY) (NEEDLE) IMPLANT
NS IRRIG 1000ML POUR BTL (IV SOLUTION) ×4 IMPLANT
PACK CAROTID (CUSTOM PROCEDURE TRAY) ×2 IMPLANT
PAD ARMBOARD 7.5X6 YLW CONV (MISCELLANEOUS) ×4 IMPLANT
PATCH HEMASHIELD 8X75 (Vascular Products) ×1 IMPLANT
SHUNT CAROTID BYPASS 10 (VASCULAR PRODUCTS) ×1 IMPLANT
SHUNT CAROTID BYPASS 12FRX15.5 (VASCULAR PRODUCTS) IMPLANT
SUT ETHILON 3 0 PS 1 (SUTURE) IMPLANT
SUT PROLENE 6 0 CC (SUTURE) ×2 IMPLANT
SUT SILK 3 0 (SUTURE)
SUT SILK 3-0 18XBRD TIE 12 (SUTURE) IMPLANT
SUT VIC AB 3-0 SH 27 (SUTURE) ×4
SUT VIC AB 3-0 SH 27X BRD (SUTURE) ×2 IMPLANT
SUT VICRYL 4-0 PS2 18IN ABS (SUTURE) ×2 IMPLANT
SYR CONTROL 10ML LL (SYRINGE) IMPLANT
TOWEL GREEN STERILE (TOWEL DISPOSABLE) ×2 IMPLANT
WATER STERILE IRR 1000ML POUR (IV SOLUTION) ×2 IMPLANT

## 2018-06-17 NOTE — Anesthesia Procedure Notes (Signed)
Arterial Line Insertion Start/End1/07/2018 7:00 AM, 06/17/2018 7:15 AM Performed by: Wilburn Cornelia, CRNA, CRNA  Patient location: Pre-op. Preanesthetic checklist: patient identified, IV checked, site marked, risks and benefits discussed, surgical consent, monitors and equipment checked, pre-op evaluation, timeout performed and anesthesia consent Lidocaine 1% used for infiltration and patient sedated Left, radial was placed Catheter size: 20 G Hand hygiene performed  and maximum sterile barriers used  Allen's test indicative of satisfactory collateral circulation Attempts: 2 Procedure performed without using ultrasound guided technique. Following insertion, dressing applied and Biopatch. Post procedure assessment: normal  Patient tolerated the procedure well with no immediate complications.

## 2018-06-17 NOTE — Op Note (Signed)
    OPERATIVE REPORT  DATE OF SURGERY: 06/17/2018  PATIENT: Anthony Medina, 58 y.o. 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  PHYSICIAN ASSISTANT: Laurence Slate, PA-C  ANESTHESIA: General  EBL: per anesthesia record  Total I/O In: 1440 [P.O.:240; I.V.:1200] Out: 275 [Urine:225; Blood:50]  BLOOD ADMINISTERED: none  DRAINS: none  SPECIMEN: none  COUNTS CORRECT:  YES  PATIENT DISPOSITION:  PACU - hemodynamically stable  PROCEDURE DETAILS: The patient was taken to the operating room placed supine position where the area of the right neck was prepped and draped in usual sterile fashion.  Incision was made anterior sternocleidomastoid and carried down through the platysma with electrocautery.  The sternocleidomastoid was reflected posteriorly and the carotid sheath was opened.  The facial vein was ligated with 2-0 silk ties and divided.  The common carotid artery was encircled with an umbilical tape and Rummel tourniquet.  Dissection was continued onto the carotid bifurcation.  The superior thyroid artery was encircled with a 2-0 silk Potts tie.  The external carotid was encircled with a blue vessel loop and the internal carotid was encircled with an umbilical tape and Rummel tourniquet.  The patient had a relatively small internal carotid artery.  The vagus and hypoglossal nerves were identified and preserved.  The patient was given 8000 units of intravenous heparin.  After adequate circulation time the internal, external and common carotid arteries were occluded.  The common carotid artery was opened with an 11 blade and extended longitudinally with Potts scissors through the plaque onto the internal carotid artery.  A 10 shunt was passed up the internal carotid, allowed to  backbleed and then down the common carotid where secured with Rummel tourniquet.  The endarterectomy was begun on the common carotid artery and the plaque was divided proximally with Potts scissors.  The endarterectomy was continued onto the bifurcation.  The external carotid was endarterectomized with an eversion technique and the internal carotid was endarterectomized in an open fashion.  Remaining atheromatous debris was removed from the endarterectomy plane.  A Finesse Hemashield Dacron patch was brought onto the field and was sewn as a patch angioplasty with a running 6-0 Prolene suture.  Prior to completion of the closure the shunt was removed and the usual flushing maneuvers were undertaken.  The anastomosis was completed and flow was restored first to the external and then the internal carotid artery.  Excellent flow characteristics were noted with hand-held Doppler in the internal and external carotid arteries.  The patient was given 50 mg of protamine to reverse the heparin.  The wounds were irrigated with saline.  Hemostasis was obtained electrocautery.  The wounds were closed with several 3-0 Vicryl sutures to reapproximate the sternocleidomastoid over the carotid sheath.  Next the platysma was closed with a running 3-0 Vicryl suture and finally the skin was closed with a 4-0 subcuticular Vicryl stitch.  Dermabond was applied to the incision.  The patient was awakened neurologically intact in the operating room and was transferred to the recovery room in stable condition   Rosetta Posner, M.D., [facility] 06/17/2018 5:07 PM

## 2018-06-17 NOTE — Progress Notes (Signed)
   S/P right CEA Moving all 4 ext. No tongue deviation, smile is symmetric  Stable disposition with mild hypotension.  A line D/C'd will go by Cuff pressures.  Roxy Horseman PA-C

## 2018-06-17 NOTE — Anesthesia Postprocedure Evaluation (Signed)
Anesthesia Post Note  Patient: Anthony Medina  Procedure(s) Performed: ENDARTERECTOMY CAROTID RIGHT (Right Neck) PATCH ANGIOPLASTY (Right Neck)     Patient location during evaluation: PACU Anesthesia Type: General Level of consciousness: awake and alert Pain management: pain level controlled Vital Signs Assessment: post-procedure vital signs reviewed and stable Respiratory status: spontaneous breathing, nonlabored ventilation and respiratory function stable Cardiovascular status: blood pressure returned to baseline and stable Postop Assessment: no apparent nausea or vomiting Anesthetic complications: no    Last Vitals:  Vitals:   06/17/18 1207 06/17/18 1222  BP: (!) 89/49 (!) 91/50  Pulse: (!) 48 (!) 40  Resp: 14 10  Temp:    SpO2: 94% 95%    Last Pain:  Vitals:   06/17/18 1150  TempSrc:   PainSc: 4                  Kennieth Plotts,W. EDMOND

## 2018-06-17 NOTE — Interval H&P Note (Signed)
History and Physical Interval Note:  06/17/2018 7:04 AM  Anthony Medina  has presented today for surgery, with the diagnosis of RIGHT CAROTID ARTERY STENOSIS  The various methods of treatment have been discussed with the patient and family. After consideration of risks, benefits and other options for treatment, the patient has consented to  Procedure(s): ENDARTERECTOMY CAROTID RIGHT (Right) as a surgical intervention .  The patient's history has been reviewed, patient examined, no change in status, stable for surgery.  I have reviewed the patient's chart and labs.  Questions were answered to the patient's satisfaction.     Curt Jews

## 2018-06-17 NOTE — Telephone Encounter (Signed)
sch appt lvm mld ltr 06/29/2018 345am p/o MD

## 2018-06-17 NOTE — Discharge Instructions (Signed)
   [facility]  Discharge Instructions   Carotid Endarterectomy (CEA)  Please refer to the following instructions for your post-procedure care. Your surgeon or physician assistant will discuss any changes with you.  Activity  You are encouraged to walk as much as you can. You can slowly return to normal activities but must avoid strenuous activity and heavy lifting until your doctor tell you it's OK. Avoid activities such as vacuuming or swinging a golf club. You can drive after one week if you are comfortable and you are no longer taking prescription pain medications. It is normal to feel tired for serval weeks after your surgery. It is also normal to have difficulty with sleep habits, eating, and bowel movements after surgery. These will go away with time.  Bathing/Showering  You may shower after you come home. Do not soak in a bathtub, hot tub, or swim until the incision heals completely.  Incision Care  Shower every day. Clean your incision with mild soap and water. Pat the area dry with a clean towel. You do not need a bandage unless otherwise instructed. Do not apply any ointments or creams to your incision. You may have skin glue on your incision. Do not peel it off. It will come off on its own in about one week. Your incision may feel thickened and raised for several weeks after your surgery. This is normal and the skin will soften over time. For Men Only: It's OK to shave around the incision but do not shave the incision itself for 2 weeks. It is common to have numbness under your chin that could last for several months.  Diet  Resume your normal diet. There are no special food restrictions following this procedure. A low fat/low cholesterol diet is recommended for all patients with vascular disease. In order to heal from your surgery, it is CRITICAL to get adequate nutrition. Your body requires vitamins, minerals, and protein. Vegetables are the best  source of vitamins and minerals. Vegetables also provide the perfect balance of protein. Processed food has little nutritional value, so try to avoid this.        Medications  Resume taking all of your medications unless your doctor or physician assistant tells you not to. If your incision is causing pain, you may take over-the- counter pain relievers such as acetaminophen (Tylenol). If you were prescribed a stronger pain medication, please be aware these medications can cause nausea and constipation. Prevent nausea by taking the medication with a snack or meal. Avoid constipation by drinking plenty of fluids and eating foods with a high amount of fiber, such as fruits, vegetables, and grains. Do not take Tylenol if you are taking prescription pain medications.  Follow Up  Our office will schedule a follow up appointment 2-3 weeks following discharge.  Please call us immediately for any of the following conditions  Increased pain, redness, drainage (pus) from your incision site. Fever of 101 degrees or higher. If you should develop stroke (slurred speech, difficulty swallowing, weakness on one side of your body, loss of vision) you should call 911 and go to the nearest emergency room.  Reduce your risk of vascular disease:  Stop smoking. If you would like help call [facility] at 1-800-QUIT-NOW (1-800-784-8669) or [place] at 336-586-4000. Manage your cholesterol Maintain a desired weight Control your diabetes Keep your blood pressure down  If you have any questions, please call the office at [phone].   

## 2018-06-17 NOTE — Progress Notes (Signed)
CCMD notified that parameters would be changed to reflect patient's low heart rate in 40's. Low blood pressures and arterial pressure were decreased as well for patient. Pressures and heart rate returning to baseline throughout shift. Pt resting with call bell within reach.  Will continue to monitor.

## 2018-06-17 NOTE — Telephone Encounter (Signed)
-----   Message from Ulyses Amor, [place] sent at 06/17/2018  9:39 AM EST -----  S/P right CEA by Dr. Donnetta Hutching.  This is DR. Roxy Manns one of our Cardiothoracic surgeons and Dr. Donnetta Hutching wants to see him at 2 weeks or sooner, not later thanks [facility]

## 2018-06-17 NOTE — Anesthesia Procedure Notes (Signed)
Procedure Name: Intubation Date/Time: 06/17/2018 7:29 AM Performed by: Wilburn Cornelia, CRNA Pre-anesthesia Checklist: Patient identified, Emergency Drugs available, Suction available, Patient being monitored and Timeout performed Patient Re-evaluated:Patient Re-evaluated prior to induction Oxygen Delivery Method: Circle system utilized Preoxygenation: Pre-oxygenation with 100% oxygen Induction Type: IV induction Ventilation: Mask ventilation without difficulty Laryngoscope Size: Mac and 4 Grade View: Grade I Tube type: Oral Tube size: 7.5 mm Number of attempts: 1 Airway Equipment and Method: Stylet Placement Confirmation: ETT inserted through vocal cords under direct vision,  CO2 detector,  breath sounds checked- equal and bilateral and positive ETCO2 Secured at: 22 cm Tube secured with: Tape Dental Injury: Teeth and Oropharynx as per pre-operative assessment

## 2018-06-17 NOTE — Transfer of Care (Signed)
Immediate Anesthesia Transfer of Care Note  Patient: Anthony Medina  Procedure(s) Performed: ENDARTERECTOMY CAROTID RIGHT (Right Neck) PATCH ANGIOPLASTY (Right Neck)  Patient Location: PACU  Anesthesia Type:General  Level of Consciousness: awake, alert  and oriented  Airway & Oxygen Therapy: Patient Spontanous Breathing and Patient connected to nasal cannula oxygen  Post-op Assessment: Report given to RN and Post -op Vital signs reviewed and stable  Post vital signs: Reviewed and stable  Last Vitals:  Vitals Value Taken Time  BP 106/56 06/17/2018 10:07 AM  Temp    Pulse 63 06/17/2018 10:10 AM  Resp 14 06/17/2018 10:10 AM  SpO2 100 % 06/17/2018 10:10 AM  Vitals shown include unvalidated device data.  Last Pain:  Vitals:   06/17/18 0609  TempSrc:   PainSc: 0-No pain      Patients Stated Pain Goal: 4 (62/37/62 8315)  Complications: No apparent anesthesia complications

## 2018-06-17 NOTE — Progress Notes (Signed)
Removed left arterial line per order. Pressure held and pressure dressing applied. Pt tolerated well. Pt aware to call if signs of bleeding. Pt resting with call bell within reach.  Will continue to monitor.

## 2018-06-17 NOTE — Progress Notes (Addendum)
Patient oriented to unit. Vital signs taken and arterial line zeroed and checked. CHG bath given. Pt resting with call bell within reach. Family at bedside. Will monitor neuro assessments. Patient and family aware to notify RN of any changes in status. Will continue to monitor. Payton Emerald, RN

## 2018-06-17 NOTE — Progress Notes (Signed)
Rec'd call from lab for critical hgb. Appears to be false and will redraw lab. Pt resting with call bell within reach.  Will continue to monitor.

## 2018-06-18 ENCOUNTER — Encounter (HOSPITAL_COMMUNITY): Payer: Self-pay | Admitting: Vascular Surgery

## 2018-06-18 MED ORDER — OXYCODONE HCL 5 MG PO TABS: 5.0000 mg | ORAL_TABLET | Freq: Four times a day (QID) | ORAL | 0 refills | Status: DC | PRN

## 2018-06-18 NOTE — Progress Notes (Addendum)
[facility]  Subjective  - doing well.   Objective 105/61 62 98.2 F (36.8 C) (Oral) 14 97%  Intake/Output Summary (Last 24 hours) at 06/18/2018 0704 Last data filed at 06/18/2018 0300 Gross per 24 hour  Intake 1730 ml  Output 275 ml  Net 1455 ml    Moving all 4 ext.  Right neck incision healing well without hematoma No tongue deviation, and smile is symmetric Heart bradycardia, baseline Lungs non labored breathing  Assessment/Planning: POD # right CEA  No neurologic deficits Stable for discharge home F/U in 2 weeks with Dr. Armstead Peaks 06/18/2018 7:04 AM --  Laboratory Lab Results: Recent Labs    06/15/18 1027 06/17/18 1705  WBC 4.2 4.0  HGB 13.6 5.9*  HCT 42.2 19.0*  PLT 201  --    BMET Recent Labs    06/15/18 1027 06/17/18 1705  NA 139  --   K 4.2  --   CL 101  --   CO2 29  --   GLUCOSE 94  --   BUN 16  --   CREATININE 1.13 <0.30*  CALCIUM 9.1  --     COAG Lab Results  Component Value Date   INR 1.00 06/15/2018   No results found for: PTT  I have examined the patient, reviewed and agree with above.  Looks great this morning.  Neurologically intact.  Discharged to home  Curt Jews, MD 06/18/2018 7:59 AM

## 2018-06-18 NOTE — Progress Notes (Signed)
Order received to discharge patient.  PIV access removed.  Telemetry monitor removed and CCMD notified.  Discharge instructions, follow up, medication and instructions for their use discussed with patient.

## 2018-06-18 NOTE — Discharge Summary (Signed)
[facility] Discharge Summary   Patient ID:  Anthony Medina MRN: [id number] DOB/AGE: 01/13/1961 58 y.o.  Admit date: 06/17/2018 Discharge date: 06/18/2018 Date of Surgery: 06/17/2018 Surgeon: Surgeon(s): Early, Arvilla Meres, MD  Admission Diagnosis: RIGHT CAROTID ARTERY STENOSIS  Discharge Diagnoses:  RIGHT CAROTID ARTERY STENOSIS  Secondary Diagnoses: Past Medical History:  Diagnosis Date  . Carotid stenosis, right   . Carotid stenosis, right    INTERNAL  . Essential hypertension, borderline   . GERD (gastroesophageal reflux disease)   . Hyperlipidemia   . Skin cancer, basal cell    multiple, non-invasive  . Wears glasses     Procedure(s): ENDARTERECTOMY CAROTID RIGHT PATCH ANGIOPLASTY  Discharged Condition: good  HPI:  Anthony Medina is a 58 y.o. male, who is seen today for discussion of symptomatic right carotid stenosis.  He is here today with his wife.  He had 2 separate episodes of classic right eye amaurosis fugax 5 to 6 months ago.  He initiated aspirin therapy and has had no further episodes since then.  He specifically denies any prior episodes of a aphasia or focal neurologic deficit.  He has medical treatment for hypertension and hyperlipidemia but otherwise is in excellent health.  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  Outpatient carotid duplex revealed critical stenosis in his right internal carotid artery and he subsequently underwent CT angiogram for further evaluation   Hospital Course:  Anthony Medina is a 58 y.o. male is S/P Right Procedure(s): ENDARTERECTOMY CAROTID RIGHT PATCH ANGIOPLASTY  Right neck incision healing well, no neurologic deficits. Stable for discharge home.  Significant Diagnostic Studies: CBC Lab Results  Component Value Date   WBC 4.0 06/17/2018   HGB 5.9 (LL) 06/17/2018   HCT 19.0 (L) 06/17/2018   MCV 102.7 (H) 06/17/2018   PLT 201 06/15/2018    BMET     Component Value Date/Time   NA 139 06/15/2018 1027   K 4.2 06/15/2018 1027   CL 101 06/15/2018 1027   CO2 29 06/15/2018 1027   GLUCOSE 94 06/15/2018 1027   BUN 16 06/15/2018 1027   CREATININE <0.30 (L) 06/17/2018 1705   CALCIUM 9.1 06/15/2018 1027   GFRNONAA NOT CALCULATED 06/17/2018 1705   GFRAA NOT CALCULATED 06/17/2018 1705   COAG Lab Results  Component Value Date   INR 1.00 06/15/2018     Disposition:  Discharge to :Home Discharge Instructions    Call MD for:  redness, tenderness, or signs of infection (pain, swelling, bleeding, redness, odor or green/yellow discharge around incision site)   Complete by:  As directed    Call MD for:  severe or increased pain, loss or decreased feeling  in affected limb(s)   Complete by:  As directed    Call MD for:  temperature >100.5   Complete by:  As directed    Resume previous diet   Complete by:  As directed      Allergies as of 06/18/2018   No Known Allergies     Medication List    TAKE these medications   acetaminophen 500 MG tablet Commonly known as:  TYLENOL Take 500-1,000 mg by mouth every 6 (six) hours as needed for headache.   aspirin 325 MG EC tablet Take 325 mg by mouth daily.   lisinopril 10 MG tablet Commonly known as:  PRINIVIL,ZESTRIL Take 10 mg by mouth daily.   naproxen sodium 220 MG tablet Commonly known as:  ALEVE Take 563-149  mg by mouth 2 (two) times daily as needed (mild pain).   oxyCODONE 5 MG immediate release tablet Commonly known as:  Oxy IR/ROXICODONE Take 1 tablet (5 mg total) by mouth every 6 (six) hours as needed for moderate pain.   pantoprazole 40 MG tablet Commonly known as:  PROTONIX Take 40 mg by mouth daily.   rosuvastatin 40 MG tablet Commonly known as:  CRESTOR Take 1 tablet (40 mg total) by mouth daily.   sildenafil 50 MG tablet Commonly known as:  VIAGRA Take 50 mg by mouth daily as needed for erectile dysfunction.   vitamin B-12 1000 MCG tablet Commonly known as:   CYANOCOBALAMIN Take 1,000 mcg by mouth every 7 (seven) days. Notes to patient:  Take weekly as directed      Verbal and written Discharge instructions given to the patient. Wound care per Discharge AVS Follow-up Information    Early, Arvilla Meres, MD In 2 weeks.   Specialties:  Vascular Surgery, Cardiology Why:  Office will call you to arrange your appt (sent) Contact information: [place] [phone]           Signed: Roxy Horseman 06/18/2018, 8:53 AM --- For VQI Registry use --- Instructions: Press F2 to tab through selections.  Delete question if not applicable.   Modified Rankin score at D/C (0-6): Rankin Score=0  IV medication needed for:  1. Hypertension: No 2. Hypotension: No  Post-op Complications: No  1. Post-op CVA or TIA: No  If yes: Event classification (right eye, left eye, right cortical, left cortical, verterobasilar, other):   If yes: Timing of event (intra-op, <6 hrs post-op, >=6 hrs post-op, unknown):   2. CN injury: No  If yes: CN  injuried   3. Myocardial infarction: No  If yes: Dx by (EKG or clinical, Troponin):   4.  CHF: No  5.  Dysrhythmia (new): No  6. Wound infection: No  7. Reperfusion symptoms: No  8. Return to OR: No  If yes: return to OR for (bleeding, neurologic, other CEA incision, other):   Discharge medications: Statin use:  Yes ASA use:  Yes Beta blocker use:  No  for medical reason   ACE-Inhibitor use:  Yes P2Y12 Antagonist use: [x ] None, [ ]  Plavix, [ ]  Plasugrel, [ ]  Ticlopinine, [ ]  Ticagrelor, [ ]  Other, [ ]  No for medical reason, [ ]  Non-compliant, [ ]  Not-indicated Anti-coagulant use:  [x ] None, [ ]  Warfarin, [ ]  Rivaroxaban, [ ]  Dabigatran, [ ]  Other, [ ]  No for medical reason, [ ]  Non-compliant, [ ]  Not-indicated

## 2018-06-21 ENCOUNTER — Other Ambulatory Visit: Payer: Self-pay | Admitting: *Deleted

## 2018-06-21 NOTE — Patient Outreach (Addendum)
[name]) Care Management  06/21/2018  Anthony Medina August 16, 1960 [id number]  Transition of care telephone call:  Initial unsuccessful telephone call to patient's preferred number (mobile) in order to complete transition of care assessment; no answer, left HIPAA compliant voicemail message requesting return call.  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 Comorbidities include: HTN, GERD, hyperlipidemia, and basal cell skin cancer. He was discharged to home on 06/18/18  without the need for home health services or durable medical equipment.   Plan: Dr Anthony Medina returned call and left message at 2:49 pm.. Will call him on 06/22/18 to complete transition of care assessment.   Barrington Ellison RN,CCM,CDE [facility] Management Coordinator Office Phone [phone] Office Fax [phone]

## 2018-06-22 ENCOUNTER — Encounter: Payer: Self-pay | Admitting: Vascular Surgery

## 2018-06-22 ENCOUNTER — Ambulatory Visit: Payer: Self-pay | Admitting: *Deleted

## 2018-06-22 ENCOUNTER — Other Ambulatory Visit: Payer: Self-pay | Admitting: *Deleted

## 2018-06-22 NOTE — Patient Outreach (Signed)
[facility]) Care Management  06/22/2018  Anthony Medina 1960/07/04 [id number]   Transition of care call Initial outreach: 06/21/18  Subjective: Returned call to Dr. Roxy Manns ( mobile number) after he left a message for this RNCM on 06/21/18 at 2:49 pm in response to RNCM's initial call.   2 HIPAA identifiers verified. Explained purpose of call and completed transition of care assessment. 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 Comorbidities include: HTN, GERD, hyperlipidemia, and basal cell skin cancer. He was discharged to home on 06/18/18  without the need for home health services or durable medical equipment.  Assessment:  See transition of care flowsheet for assessment details. .   Plan:  No ongoing care management needs identified so will close case to [facility] care management services and route successful outreach letter to [facility] clinical pool to be mailed to patient's home address.   Barrington Ellison RN,CCM,CDE [place] Management Coordinator Office Phone [phone] Office Fax [phone]

## 2018-06-24 ENCOUNTER — Ambulatory Visit: Payer: 59 | Admitting: *Deleted

## 2018-06-29 ENCOUNTER — Encounter: Payer: 59 | Admitting: Vascular Surgery

## 2018-07-06 ENCOUNTER — Ambulatory Visit (INDEPENDENT_AMBULATORY_CARE_PROVIDER_SITE_OTHER): Payer: Self-pay | Admitting: Vascular Surgery

## 2018-07-06 DIAGNOSIS — G453 Amaurosis fugax: Secondary | ICD-10-CM

## 2018-07-06 DIAGNOSIS — I6521 Occlusion and stenosis of right carotid artery: Secondary | ICD-10-CM

## 2018-07-06 NOTE — Progress Notes (Signed)
   Patient name: Anthony Medina MRN: [id number] DOB: 02-10-1961 Sex: male  REASON FOR VISIT: Follow-up recent right carotid endarterectomy  HPI: Anthony Medina is a 58 y.o. male seen today status post right carotid endarterectomy for symptomatic right carotid stenosis with 2 episodes of amaurosis fugax.  Extremely well in the hospital and was discharged to home on postoperative day 1.  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  Current Outpatient Medications  Medication Sig Dispense Refill  . acetaminophen (TYLENOL) 500 MG tablet Take 500-1,000 mg by mouth every 6 (six) hours as needed for headache.    Marland Kitchen aspirin 325 MG EC tablet Take 325 mg by mouth daily.    Marland Kitchen lisinopril (PRINIVIL,ZESTRIL) 10 MG tablet Take 10 mg by mouth daily.    . naproxen sodium (ALEVE) 220 MG tablet Take 220-440 mg by mouth 2 (two) times daily as needed (mild pain).    Marland Kitchen oxyCODONE (OXY IR/ROXICODONE) 5 MG immediate release tablet Take 1 tablet (5 mg total) by mouth every 6 (six) hours as needed for moderate pain. 6 tablet 0  . pantoprazole (PROTONIX) 40 MG tablet Take 40 mg by mouth daily.    . rosuvastatin (CRESTOR) 40 MG tablet Take 1 tablet (40 mg total) by mouth daily. 90 tablet 3  . sildenafil (VIAGRA) 50 MG tablet Take 50 mg by mouth daily as needed for erectile dysfunction.    . vitamin B-12 (CYANOCOBALAMIN) 1000 MCG tablet Take 1,000 mcg by mouth every 7 (seven) days.      No current facility-administered medications for this visit.      PHYSICAL EXAM: There were no vitals filed for this visit.  GENERAL: The patient is a well-nourished male, in no acute distress. The vital signs are documented above. Right neck incision is healing with usual healing ridge.  Expected peri-incisional numbness.  Neurologically intact  MEDICAL ISSUES: Stable status post right carotid endarterectomy on 06/17/2018.  Continue  full activities.  We will see him again in 9 months with repeat carotid duplex   Rosetta Posner, MD [facility] Tel [phone] Pager [phone]

## 2018-07-08 ENCOUNTER — Other Ambulatory Visit: Payer: Self-pay | Admitting: Thoracic Surgery (Cardiothoracic Vascular Surgery)

## 2018-07-08 NOTE — Progress Notes (Signed)
Corrected medication list

## 2018-07-13 ENCOUNTER — Encounter (HOSPITAL_COMMUNITY): Payer: 59

## 2018-07-13 ENCOUNTER — Encounter: Payer: 59 | Admitting: Vascular Surgery

## 2018-08-27 DIAGNOSIS — D2272 Melanocytic nevi of left lower limb, including hip: Secondary | ICD-10-CM | POA: Diagnosis not present

## 2018-08-27 DIAGNOSIS — D225 Melanocytic nevi of trunk: Secondary | ICD-10-CM | POA: Diagnosis not present

## 2018-08-27 DIAGNOSIS — Z85828 Personal history of other malignant neoplasm of skin: Secondary | ICD-10-CM | POA: Diagnosis not present

## 2018-08-27 DIAGNOSIS — C44519 Basal cell carcinoma of skin of other part of trunk: Secondary | ICD-10-CM | POA: Diagnosis not present

## 2018-08-27 DIAGNOSIS — L821 Other seborrheic keratosis: Secondary | ICD-10-CM | POA: Diagnosis not present

## 2018-08-27 DIAGNOSIS — D485 Neoplasm of uncertain behavior of skin: Secondary | ICD-10-CM | POA: Diagnosis not present

## 2018-08-27 DIAGNOSIS — D1801 Hemangioma of skin and subcutaneous tissue: Secondary | ICD-10-CM | POA: Diagnosis not present

## 2018-08-27 DIAGNOSIS — L570 Actinic keratosis: Secondary | ICD-10-CM | POA: Diagnosis not present

## 2018-08-30 ENCOUNTER — Encounter: Payer: Self-pay | Admitting: Cardiology

## 2018-08-30 DIAGNOSIS — E7800 Pure hypercholesterolemia, unspecified: Secondary | ICD-10-CM | POA: Diagnosis not present

## 2018-08-30 NOTE — Progress Notes (Deleted)
Anthony Bear, MD Reason for referral-chest pain and dyspnea  HPI: 58 year old male for evaluation of chest pain and dyspnea at request of Lavone Orn, MD.  Carotid Dopplers December 2019 showed 80 to 99% right and 1 to 39% left stenosis.  CTA December 2019 showed critical stenosis at the right internal carotid artery bulb and 40% narrowing at the left vertebral artery.  Had carotid endarterectomy January 2020.  Current Outpatient Medications  Medication Sig Dispense Refill  . acetaminophen (TYLENOL) 500 MG tablet Take 500-1,000 mg by mouth every 6 (six) hours as needed for headache.    Marland Kitchen aspirin 325 MG EC tablet Take 325 mg by mouth daily.    Marland Kitchen lisinopril (PRINIVIL,ZESTRIL) 10 MG tablet Take 10 mg by mouth daily.    . naproxen sodium (ALEVE) 220 MG tablet Take 220-440 mg by mouth 2 (two) times daily as needed (mild pain).    . pantoprazole (PROTONIX) 40 MG tablet Take 40 mg by mouth daily.    . rosuvastatin (CRESTOR) 40 MG tablet Take 1 tablet (40 mg total) by mouth daily. 90 tablet 3  . vitamin B-12 (CYANOCOBALAMIN) 1000 MCG tablet Take 1,000 mcg by mouth every 7 (seven) days.      No current facility-administered medications for this visit.     No Known Allergies  Past Medical History:  Diagnosis Date  . Carotid stenosis, right   . Carotid stenosis, right    INTERNAL  . Essential hypertension, borderline   . GERD (gastroesophageal reflux disease)   . Hyperlipidemia   . Skin cancer, basal cell    multiple, non-invasive  . Wears glasses     Past Surgical History:  Procedure Laterality Date  . APPENDECTOMY  1977  . CAROTID ENDARTERECTOMY Right 06/17/2018  . COLONOSCOPY    . ENDARTERECTOMY Right 06/17/2018   Procedure: ENDARTERECTOMY CAROTID RIGHT;  Surgeon: Rosetta Posner, MD;  Location: [place];  Service: Vascular;  Laterality: Right;  . HIP RESURFACING Right 05/08/2013  . [place]  . KNEE ARTHROSCOPY Left 2001  . PATCH ANGIOPLASTY Right  06/17/2018   Procedure: PATCH ANGIOPLASTY;  Surgeon: Rosetta Posner, MD;  Location: [place];  Service: Vascular;  Laterality: Right;  . WISDOM TOOTH EXTRACTION      Social History   Socioeconomic History  . Marital status: Married    Spouse name: Not on file  . Number of children: Not on file  . Years of education: Not on file  . Highest education level: Not on file  Occupational History  . Occupation: physician    Employer: [employer]: [employer]  Social Needs  . Financial resource strain: Not on file  . Food insecurity:    Worry: Not on file    Inability: Not on file  . Transportation needs:    Medical: Not on file    Non-medical: Not on file  Tobacco Use  . Smoking status: Never Smoker  . Smokeless tobacco: Never Used  Substance and Sexual Activity  . Alcohol use: Yes    Alcohol/week: 7.0 standard drinks    Types: 2 Glasses of wine, 5 Cans of beer per week    Comment:  1-2 daily  . Drug use: Never  . Sexual activity: Yes    Partners: Female    Birth control/protection: None  Lifestyle  . Physical activity:    Days per week: 6 days    Minutes per session: 90 min  . Stress: Not on  file  Relationships  . Social connections:    Talks on phone: Not on file    Gets together: Not on file    Attends religious service: Not on file    Active member of club or organization: Not on file    Attends meetings of clubs or organizations: Not on file    Relationship status: Not on file  . Intimate partner violence:    Fear of current or ex partner: Not on file    Emotionally abused: Not on file    Physically abused: Not on file    Forced sexual activity: Not on file  Other Topics Concern  . Not on file  Social History Narrative  . Not on file    Family History  Problem Relation Age of Onset  . Colon cancer Mother 46  . Alzheimer's disease Father 54  . Colon cancer Sister 28    ROS: no fevers or chills, productive cough, hemoptysis, dysphasia, odynophagia, melena,  hematochezia, dysuria, hematuria, rash, seizure activity, orthopnea, PND, pedal edema, claudication. Remaining systems are negative.  Physical Exam:   There were no vitals taken for this visit.  General:  Well developed/well nourished in NAD Skin warm/dry Patient not depressed No peripheral clubbing Back-normal HEENT-normal/normal eyelids Neck supple/normal carotid upstroke bilaterally; no bruits; no JVD; no thyromegaly chest - CTA/ normal expansion CV - RRR/normal S1 and S2; no murmurs, rubs or gallops;  PMI nondisplaced Abdomen -NT/ND, no HSM, no mass, + bowel sounds, no bruit 2+ femoral pulses, no bruits Ext-no edema, chords, 2+ DP Neuro-grossly nonfocal  ECG - personally reviewed  A/P  1  Kirk Ruths, MD

## 2018-08-31 ENCOUNTER — Ambulatory Visit: Payer: 59 | Admitting: Cardiology

## 2019-03-09 NOTE — Progress Notes (Signed)
Anthony Bear, MD Reason for referral-chest pain  HPI: 58 year old male for evaluation of chest pain at request of Lavone Orn, MD.  Carotid Dopplers December 2019 showed 80 to 99% right internal carotid artery stenosis.  CTA showed critical stenosis at the right internal carotid artery bulb.  Patient had carotid endarterectomy January 2020.  Over the past 2 to 3 years patient describes occasional chest pain.  It is in the left breast area without radiation.  Not exertional, pleuritic or related to food.  Last several minutes and resolve spontaneously.  It has been more frequent over the past 6 months.  He exercises routinely.  He does not have chest pain with exercise.  He has occasional dizziness with standing at night suddenly but otherwise denies syncope.  Cardiology now asked to evaluate.  Current Outpatient Medications  Medication Sig Dispense Refill  . acetaminophen (TYLENOL) 500 MG tablet Take 500-1,000 mg by mouth every 6 (six) hours as needed for headache.    Marland Kitchen aspirin 325 MG EC tablet Take 325 mg by mouth daily.    Marland Kitchen lisinopril (PRINIVIL,ZESTRIL) 10 MG tablet Take 10 mg by mouth daily.    . naproxen sodium (ALEVE) 220 MG tablet Take 220-440 mg by mouth 2 (two) times daily as needed (mild pain).    . pantoprazole (PROTONIX) 40 MG tablet Take 40 mg by mouth daily.    . rosuvastatin (CRESTOR) 40 MG tablet Take 1 tablet (40 mg total) by mouth daily. 90 tablet 3  . vitamin B-12 (CYANOCOBALAMIN) 1000 MCG tablet Take 1,000 mcg by mouth every 7 (seven) days.      No current facility-administered medications for this visit.     No Known Allergies  Past Medical History:  Diagnosis Date  . Carotid stenosis, right   . Essential hypertension, borderline   . GERD (gastroesophageal reflux disease)   . Hyperlipidemia   . Skin cancer, basal cell    multiple, non-invasive  . Wears glasses     Past Surgical History:  Procedure Laterality Date  . APPENDECTOMY  1977  . CAROTID  ENDARTERECTOMY Right 06/17/2018  . COLONOSCOPY    . ENDARTERECTOMY Right 06/17/2018   Procedure: ENDARTERECTOMY CAROTID RIGHT;  Surgeon: Rosetta Posner, MD;  Location: [place];  Service: Vascular;  Laterality: Right;  . HIP RESURFACING Right 05/08/2013  . [place]  . KNEE ARTHROSCOPY Left 2001  . PATCH ANGIOPLASTY Right 06/17/2018   Procedure: PATCH ANGIOPLASTY;  Surgeon: Rosetta Posner, MD;  Location: [place];  Service: Vascular;  Laterality: Right;  . WISDOM TOOTH EXTRACTION      Social History   Socioeconomic History  . Marital status: Married    Spouse name: Not on file  . Number of children: Not on file  . Years of education: Not on file  . Highest education level: Not on file  Occupational History  . Occupation: physician    Employer: [employer]: [employer]  Social Needs  . Financial resource strain: Not on file  . Food insecurity    Worry: Not on file    Inability: Not on file  . Transportation needs    Medical: Not on file    Non-medical: Not on file  Tobacco Use  . Smoking status: Never Smoker  . Smokeless tobacco: Never Used  Substance and Sexual Activity  . Alcohol use: Yes    Alcohol/week: 7.0 standard drinks    Types: 2 Glasses of wine, 5 Cans of  beer per week    Comment:  1-2 daily  . Drug use: Never  . Sexual activity: Yes    Partners: Female    Birth control/protection: None  Lifestyle  . Physical activity    Days per week: 6 days    Minutes per session: 90 min  . Stress: Not on file  Relationships  . Social Herbalist on phone: Not on file    Gets together: Not on file    Attends religious service: Not on file    Active member of club or organization: Not on file    Attends meetings of clubs or organizations: Not on file    Relationship status: Not on file  . Intimate partner violence    Fear of current or ex partner: Not on file    Emotionally abused: Not on file    Physically abused: Not on file    Forced sexual  activity: Not on file  Other Topics Concern  . Not on file  Social History Narrative  . Not on file    Family History  Problem Relation Age of Onset  . Colon cancer Mother 31  . Alzheimer's disease Father 26  . Colon cancer Sister 68    ROS: no fevers or chills, productive cough, hemoptysis, dysphasia, odynophagia, melena, hematochezia, dysuria, hematuria, rash, seizure activity, orthopnea, PND, pedal edema, claudication. Remaining systems are negative.  Physical Exam:   There were no vitals taken for this visit.  General:  Well developed/well nourished in NAD Skin warm/dry Patient not depressed No peripheral clubbing Back-normal HEENT-normal/normal eyelids Neck supple/normal carotid upstroke bilaterally; no bruits; no JVD; no thyromegaly chest - CTA/ normal expansion CV - RRR/normal S1 and S2; no murmurs, rubs or gallops;  PMI nondisplaced Abdomen -NT/ND, no HSM, no mass, + bowel sounds, no bruit 2+ femoral pulses, no bruits Ext-no edema, chords, 2+ DP Neuro-grossly nonfocal  ECG -sinus bradycardia at a rate of 59, no ST changes.  Personally reviewed  A/P  1 chest pain-symptoms are somewhat atypical and electrocardiogram shows no ST changes.  However he has a history of significant vascular disease requiring carotid endarterectomy in January of this year.  Also with hypertension, hyperlipidemia and brother with significantly elevated calcium score in the past.  We will arrange a cardiac CTA to further assess.  2 carotid artery disease-patient is followed by vascular surgery.  We will continue with aspirin and statin.  3 hyperlipidemia-given documented vascular disease we will continue with Crestor 40 mg daily.  I will have his most recent lipids and liver forwarded to [place].  Goal LDL less than 70.  4 hypertension-blood pressure is mildly elevated; I have asked him to follow this and we will increase lisinopril as needed.  He has some orthostatic symptoms at night.  Kirk Ruths, MD

## 2019-03-14 ENCOUNTER — Other Ambulatory Visit: Payer: Self-pay

## 2019-03-14 ENCOUNTER — Ambulatory Visit: Payer: 59 | Admitting: Cardiology

## 2019-03-14 ENCOUNTER — Encounter: Payer: Self-pay | Admitting: Cardiology

## 2019-03-14 VITALS — BP 138/78 | HR 59 | Ht 69.0 in | Wt 170.8 lb

## 2019-03-14 DIAGNOSIS — R072 Precordial pain: Secondary | ICD-10-CM | POA: Diagnosis not present

## 2019-03-14 DIAGNOSIS — I10 Essential (primary) hypertension: Secondary | ICD-10-CM

## 2019-03-14 DIAGNOSIS — E7800 Pure hypercholesterolemia, unspecified: Secondary | ICD-10-CM

## 2019-03-14 MED ORDER — METOPROLOL TARTRATE 50 MG PO TABS: ORAL_TABLET | ORAL | 0 refills | Status: DC

## 2019-03-14 NOTE — Patient Instructions (Signed)
Medication Instructions:  NO CHANGE If you need a refill on your cardiac medications before your next appointment, please call your pharmacy.   Lab work: If you have labs (blood work) drawn today and your tests are completely normal, you will receive your results only by: Marland Kitchen MyChart Message (if you have MyChart) OR . A paper copy in the mail If you have any lab test that is abnormal or we need to change your treatment, we will call you to review the results.  Testing/Procedures: Your cardiac CT will be scheduled at one of the below locations:   [facility] [address] [place], [place] [address] [phone]  If scheduled at [facility] please arrive at the [facility] main entrance of [facility] 30-45 minutes prior to test start time. Proceed to the [facility] Radiology Department (first floor) to check-in and test prep.  Please follow these instructions carefully (unless otherwise directed):  Hold all erectile dysfunction medications at least 3 days (72 hrs) prior to test.  On the Night Before the Test: . Be sure to Drink plenty of water. . Do not consume any caffeinated/decaffeinated beverages or chocolate 12 hours prior to your test. . Do not take any antihistamines 12 hours prior to your test. . If the patient has contrast allergy:  On the Day of the Test: . Drink plenty of water. Do not drink any water within one hour of the test. . Do not eat any food 4 hours prior to the test. . You may take your regular medications prior to the test.  . Take metoprolol (Lopressor) two hours prior to test.       After the Test: . Drink plenty of water. . After receiving IV contrast, you may experience a mild flushed feeling. This is normal. . On occasion, you may experience a mild rash up to 24 hours after the test. This is not dangerous. If this occurs, you can take Benadryl 25 mg and increase your fluid intake. . If you experience trouble breathing,  this can be serious. If it is severe call 911 IMMEDIATELY. If it is mild, please call our office. . If you take any of these medications: Glipizide/Metformin, Avandament, Glucavance, please do not take 48 hours after completing test unless otherwise instructed.    Please contact the cardiac imaging nurse navigator should you have any questions/concerns Marchia Bond, RN Navigator Cardiac Imaging [facility] [phone] Office  [phone] Cell    Follow-Up: At [facility], you and your health needs are our priority.  As part of our continuing mission to provide you with exceptional heart care, we have created designated Provider Care Teams.  These Care Teams include your primary Cardiologist (physician) and Advanced Practice Providers (APPs -  Physician Assistants and Nurse Practitioners) who all work together to provide you with the care you need, when you need it. You will need a follow up appointment in 12 months.  Please call our office 2 months in advance to schedule this appointment.  You may see Kirk Ruths MD or one of the following Advanced Practice Providers on your designated Care Team:   Kerin Ransom, PA-C Roby Lofts, [place] . Sande Rives, PA-C

## 2019-03-16 ENCOUNTER — Telehealth: Payer: Self-pay | Admitting: *Deleted

## 2019-03-16 DIAGNOSIS — E7800 Pure hypercholesterolemia, unspecified: Secondary | ICD-10-CM

## 2019-03-16 MED ORDER — EZETIMIBE 10 MG PO TABS: 10.0000 mg | ORAL_TABLET | Freq: Every day | ORAL | 3 refills | Status: DC

## 2019-03-16 NOTE — Telephone Encounter (Signed)
Patient emailed his most recent lab results to dr Stanford Breed and due to his LDL of 82, per Dr Stanford Breed we will start zetia 10 mg once daily. New script sent to the pharmacy, the patient will have repeat lab work in 12 weeks. Lab orders mailed to the pt.

## 2019-04-05 DIAGNOSIS — Z1159 Encounter for screening for other viral diseases: Secondary | ICD-10-CM | POA: Diagnosis not present

## 2019-04-08 DIAGNOSIS — Z800 Family history of malignant neoplasm of digestive organs: Secondary | ICD-10-CM | POA: Diagnosis not present

## 2019-04-08 DIAGNOSIS — Z86010 Personal history of colonic polyps: Secondary | ICD-10-CM | POA: Diagnosis not present

## 2019-04-13 ENCOUNTER — Other Ambulatory Visit: Payer: Self-pay | Admitting: *Deleted

## 2019-04-13 DIAGNOSIS — I6521 Occlusion and stenosis of right carotid artery: Secondary | ICD-10-CM

## 2019-04-15 ENCOUNTER — Other Ambulatory Visit: Payer: Self-pay

## 2019-04-15 ENCOUNTER — Telehealth (HOSPITAL_COMMUNITY): Payer: Self-pay | Admitting: Emergency Medicine

## 2019-04-15 ENCOUNTER — Encounter (HOSPITAL_COMMUNITY): Payer: 59

## 2019-04-15 ENCOUNTER — Ambulatory Visit (HOSPITAL_COMMUNITY)
Admission: RE | Admit: 2019-04-15 | Discharge: 2019-04-15 | Disposition: A | Discharge status: Home / Self Care | Payer: 59 | Source: Ambulatory Visit | Attending: Family | Admitting: Family

## 2019-04-15 DIAGNOSIS — I6521 Occlusion and stenosis of right carotid artery: Secondary | ICD-10-CM | POA: Diagnosis not present

## 2019-04-15 NOTE — Telephone Encounter (Signed)
Left message on voicemail with name and callback number Dean Goldner RN Navigator Cardiac Imaging [facility] [phone] Office [phone] Cell  

## 2019-04-15 NOTE — Telephone Encounter (Signed)
mychart message sent

## 2019-04-18 ENCOUNTER — Ambulatory Visit (HOSPITAL_COMMUNITY)
Admission: RE | Admit: 2019-04-18 | Discharge: 2019-04-18 | Disposition: A | Discharge status: Home / Self Care | Payer: 59 | Source: Ambulatory Visit | Attending: Cardiology | Admitting: Cardiology

## 2019-04-18 ENCOUNTER — Other Ambulatory Visit: Payer: Self-pay

## 2019-04-18 DIAGNOSIS — R072 Precordial pain: Secondary | ICD-10-CM | POA: Diagnosis not present

## 2019-04-18 DIAGNOSIS — I2510 Atherosclerotic heart disease of native coronary artery without angina pectoris: Secondary | ICD-10-CM | POA: Diagnosis not present

## 2019-04-18 MED ORDER — NITROGLYCERIN 0.4 MG SL SUBL
SUBLINGUAL_TABLET | SUBLINGUAL | Status: AC
  Filled 2019-04-18: qty 2

## 2019-04-18 MED ORDER — NITROGLYCERIN 0.4 MG SL SUBL
0.8000 mg | SUBLINGUAL_TABLET | Freq: Once | SUBLINGUAL | Status: AC
  Administered 2019-04-18: 0.8 mg via SUBLINGUAL

## 2019-04-18 MED ORDER — IOHEXOL 350 MG/ML SOLN
100.0000 mL | Freq: Once | INTRAVENOUS | Status: AC | PRN
  Administered 2019-04-18: 08:00:00 100 mL via INTRAVENOUS

## 2019-04-18 NOTE — Progress Notes (Signed)
Ct complete. Patient denies any complaints. Offered snack and drink.

## 2019-04-19 ENCOUNTER — Ambulatory Visit (HOSPITAL_COMMUNITY)
Admission: RE | Admit: 2019-04-19 | Discharge: 2019-04-19 | Disposition: A | Discharge status: Home / Self Care | Payer: 59 | Source: Ambulatory Visit | Attending: Cardiology | Admitting: Cardiology

## 2019-04-19 DIAGNOSIS — R072 Precordial pain: Secondary | ICD-10-CM | POA: Insufficient documentation

## 2019-04-19 DIAGNOSIS — I2510 Atherosclerotic heart disease of native coronary artery without angina pectoris: Secondary | ICD-10-CM | POA: Diagnosis not present

## 2019-04-22 ENCOUNTER — Other Ambulatory Visit: Payer: Self-pay | Admitting: *Deleted

## 2019-04-22 ENCOUNTER — Telehealth: Payer: Self-pay | Admitting: *Deleted

## 2019-04-22 DIAGNOSIS — I10 Essential (primary) hypertension: Secondary | ICD-10-CM

## 2019-04-22 MED ORDER — METOPROLOL SUCCINATE ER 25 MG PO TB24: 25.0000 mg | ORAL_TABLET | Freq: Every day | ORAL | 3 refills | Status: DC

## 2019-04-22 NOTE — Telephone Encounter (Signed)
Per dr Stanford Breed, script for metoprolol succ er 25 mg sent to the pharmacy. Patient made aware.

## 2019-05-10 ENCOUNTER — Ambulatory Visit (HOSPITAL_COMMUNITY)
Admission: RE | Admit: 2019-05-10 | Discharge: 2019-05-10 | Disposition: A | Discharge status: Home / Self Care | Payer: 59 | Source: Ambulatory Visit | Attending: Neurological Surgery | Admitting: Neurological Surgery

## 2019-05-10 ENCOUNTER — Other Ambulatory Visit: Payer: Self-pay

## 2019-05-10 ENCOUNTER — Other Ambulatory Visit (HOSPITAL_COMMUNITY): Payer: Self-pay | Admitting: Neurological Surgery

## 2019-05-10 ENCOUNTER — Other Ambulatory Visit: Payer: Self-pay | Admitting: *Deleted

## 2019-05-10 ENCOUNTER — Other Ambulatory Visit: Payer: Self-pay | Admitting: Neurological Surgery

## 2019-05-10 DIAGNOSIS — R531 Weakness: Secondary | ICD-10-CM

## 2019-05-10 DIAGNOSIS — M5416 Radiculopathy, lumbar region: Secondary | ICD-10-CM

## 2019-05-10 DIAGNOSIS — M545 Low back pain: Secondary | ICD-10-CM | POA: Diagnosis not present

## 2019-05-10 MED ORDER — LISINOPRIL 40 MG PO TABS: 40.0000 mg | ORAL_TABLET | Freq: Every day | ORAL | 3 refills | Status: DC

## 2019-05-11 DIAGNOSIS — M5126 Other intervertebral disc displacement, lumbar region: Secondary | ICD-10-CM | POA: Diagnosis not present

## 2019-05-11 DIAGNOSIS — M5416 Radiculopathy, lumbar region: Secondary | ICD-10-CM | POA: Diagnosis not present

## 2019-05-11 DIAGNOSIS — Z1159 Encounter for screening for other viral diseases: Secondary | ICD-10-CM | POA: Diagnosis not present

## 2019-05-16 DIAGNOSIS — M5126 Other intervertebral disc displacement, lumbar region: Secondary | ICD-10-CM | POA: Diagnosis not present

## 2019-05-16 DIAGNOSIS — M5116 Intervertebral disc disorders with radiculopathy, lumbar region: Secondary | ICD-10-CM | POA: Diagnosis not present

## 2019-06-20 DIAGNOSIS — I10 Essential (primary) hypertension: Secondary | ICD-10-CM | POA: Diagnosis not present

## 2019-06-20 DIAGNOSIS — I6521 Occlusion and stenosis of right carotid artery: Secondary | ICD-10-CM | POA: Diagnosis not present

## 2019-06-20 DIAGNOSIS — E7800 Pure hypercholesterolemia, unspecified: Secondary | ICD-10-CM | POA: Diagnosis not present

## 2019-06-20 DIAGNOSIS — E538 Deficiency of other specified B group vitamins: Secondary | ICD-10-CM | POA: Diagnosis not present

## 2019-06-20 DIAGNOSIS — I2510 Atherosclerotic heart disease of native coronary artery without angina pectoris: Secondary | ICD-10-CM | POA: Diagnosis not present

## 2019-06-20 DIAGNOSIS — Z0000 Encounter for general adult medical examination without abnormal findings: Secondary | ICD-10-CM | POA: Diagnosis not present

## 2019-06-20 DIAGNOSIS — N400 Enlarged prostate without lower urinary tract symptoms: Secondary | ICD-10-CM | POA: Diagnosis not present

## 2019-06-20 DIAGNOSIS — Z125 Encounter for screening for malignant neoplasm of prostate: Secondary | ICD-10-CM | POA: Diagnosis not present

## 2019-06-20 DIAGNOSIS — M47896 Other spondylosis, lumbar region: Secondary | ICD-10-CM | POA: Diagnosis not present

## 2019-06-20 DIAGNOSIS — K219 Gastro-esophageal reflux disease without esophagitis: Secondary | ICD-10-CM | POA: Diagnosis not present

## 2019-07-25 DIAGNOSIS — Z23 Encounter for immunization: Secondary | ICD-10-CM | POA: Diagnosis not present

## 2019-11-04 DIAGNOSIS — Z85828 Personal history of other malignant neoplasm of skin: Secondary | ICD-10-CM | POA: Diagnosis not present

## 2019-11-04 DIAGNOSIS — L821 Other seborrheic keratosis: Secondary | ICD-10-CM | POA: Diagnosis not present

## 2019-11-04 DIAGNOSIS — D2272 Melanocytic nevi of left lower limb, including hip: Secondary | ICD-10-CM | POA: Diagnosis not present

## 2019-11-04 DIAGNOSIS — D225 Melanocytic nevi of trunk: Secondary | ICD-10-CM | POA: Diagnosis not present

## 2019-11-04 DIAGNOSIS — L570 Actinic keratosis: Secondary | ICD-10-CM | POA: Diagnosis not present

## 2019-11-04 DIAGNOSIS — L814 Other melanin hyperpigmentation: Secondary | ICD-10-CM | POA: Diagnosis not present

## 2019-11-07 ENCOUNTER — Other Ambulatory Visit (HOSPITAL_COMMUNITY): Payer: Self-pay | Admitting: Internal Medicine

## 2020-01-11 ENCOUNTER — Other Ambulatory Visit: Payer: Self-pay | Admitting: Orthopedic Surgery

## 2020-01-11 ENCOUNTER — Ambulatory Visit
Admission: RE | Admit: 2020-01-11 | Discharge: 2020-01-11 | Disposition: A | Discharge status: Home / Self Care | Payer: 59 | Source: Ambulatory Visit | Attending: Orthopedic Surgery | Admitting: Orthopedic Surgery

## 2020-01-11 DIAGNOSIS — M1612 Unilateral primary osteoarthritis, left hip: Secondary | ICD-10-CM | POA: Diagnosis not present

## 2020-01-11 DIAGNOSIS — M25559 Pain in unspecified hip: Secondary | ICD-10-CM

## 2020-03-12 ENCOUNTER — Other Ambulatory Visit (HOSPITAL_COMMUNITY): Payer: Self-pay | Admitting: Internal Medicine

## 2020-04-06 ENCOUNTER — Other Ambulatory Visit: Payer: Self-pay | Admitting: *Deleted

## 2020-04-06 ENCOUNTER — Ambulatory Visit: Payer: 59 | Admitting: Cardiology

## 2020-04-06 ENCOUNTER — Encounter: Payer: Self-pay | Admitting: Cardiology

## 2020-04-06 ENCOUNTER — Other Ambulatory Visit: Payer: Self-pay

## 2020-04-06 VITALS — BP 120/68 | HR 61 | Ht 69.0 in | Wt 170.8 lb

## 2020-04-06 DIAGNOSIS — R072 Precordial pain: Secondary | ICD-10-CM

## 2020-04-06 DIAGNOSIS — I2510 Atherosclerotic heart disease of native coronary artery without angina pectoris: Secondary | ICD-10-CM

## 2020-04-06 DIAGNOSIS — E7800 Pure hypercholesterolemia, unspecified: Secondary | ICD-10-CM

## 2020-04-06 LAB — TROPONIN T: Troponin T (Highly Sensitive): 7 ng/L (ref 0–22)

## 2020-04-06 NOTE — Progress Notes (Signed)
This encounter was created in error - please disregard.

## 2020-04-06 NOTE — Progress Notes (Signed)
HPI: FU CAD. Carotid Dopplers December 2019 showed 80 to 99% right internal carotid artery stenosis.  CTA showed critical stenosis at the right internal carotid artery bulb.Patient had carotid endarterectomy January 2020. Carotid Dopplers October 2020 showed patent right carotid endarterectomy site and 1 to 39% on the left. Cardiac CTA November 2020 showed calcium score 522, moderate stenosis in the proximal LAD (50 to 69%) and proximal right coronary artery as well. FFR showed significant stenosis in small D2 but not significant in the LAD or right coronary artery.  He has been treated medically. Hip x-rays July 2021 showed peripheral vascular calcification consistent with peripheral vascular disease. Since last seen patient denies dyspnea on exertion, orthopnea, pedal edema or syncope.  He rides his bicycle routinely without any symptoms of dyspnea or chest pain.  Yesterday patient had a stressful surgical case.  He ate a small breakfast but then had nothing for lunch.  During a particularly stressful part he suddenly developed dizziness.  No associated chest pain, dyspnea, palpitations, nausea or diaphoresis.  He did not have syncope.  He was sat down in the operating room and his symptoms resolved after 30 seconds.  He was given IV fluids and then operated for an additional 6 hours with no symptoms.  Since that time he has had vague pain in the left breast area.  He has had this intermittently for years that he associates with stress.  Typically lasts seconds to minutes.  No radiation or associated symptoms.  Patient recently discontinued Toprol as it was causing fatigue.  Amlodipine was initiated for blood pressure and and this has improved.  Current Outpatient Medications  Medication Sig Dispense Refill  . acetaminophen (TYLENOL) 500 MG tablet Take 500-1,000 mg by mouth every 6 (six) hours as needed for headache.    Marland Kitchen aspirin 81 MG EC tablet Take 325 mg by mouth daily.     Marland Kitchen ezetimibe (ZETIA)  10 MG tablet Take 1 tablet (10 mg total) by mouth daily. 90 tablet 3  . lisinopril (ZESTRIL) 40 MG tablet Take 1 tablet (40 mg total) by mouth daily. 90 tablet 3  . metoprolol succinate (TOPROL XL) 25 MG 24 hr tablet Take 1 tablet (25 mg total) by mouth daily. 90 tablet 3  . naproxen sodium (ALEVE) 220 MG tablet Take 220-440 mg by mouth 2 (two) times daily as needed (mild pain).    . pantoprazole (PROTONIX) 40 MG tablet Take 40 mg by mouth daily.    . rosuvastatin (CRESTOR) 40 MG tablet Take 1 tablet (40 mg total) by mouth daily. 90 tablet 3  . vitamin B-12 (CYANOCOBALAMIN) 1000 MCG tablet Take 1,000 mcg by mouth every 7 (seven) days.      No current facility-administered medications for this visit.     Past Medical History:  Diagnosis Date  . CAD (coronary artery disease)   . Carotid stenosis, right   . Essential hypertension, borderline   . GERD (gastroesophageal reflux disease)   . Hyperlipidemia   . Skin cancer, basal cell    multiple, non-invasive  . Wears glasses     Past Surgical History:  Procedure Laterality Date  . APPENDECTOMY  1977  . CAROTID ENDARTERECTOMY Right 06/17/2018  . COLONOSCOPY    . ENDARTERECTOMY Right 06/17/2018   Procedure: ENDARTERECTOMY CAROTID RIGHT;  Surgeon: Rosetta Posner, MD;  Location: [facility];  Service: Vascular;  Laterality: Right;  . HIP RESURFACING Right 05/08/2013  . [place]  .  KNEE ARTHROSCOPY Left 2001  . PATCH ANGIOPLASTY Right 06/17/2018   Procedure: PATCH ANGIOPLASTY;  Surgeon: Rosetta Posner, MD;  Location: [facility];  Service: Vascular;  Laterality: Right;  . WISDOM TOOTH EXTRACTION      Social History   Socioeconomic History  . Marital status: Married    Spouse name: Not on file  . Number of children: 3  . Years of education: Not on file  . Highest education level: Not on file  Occupational History  . Occupation: physician    Employer: [employer]    Comment: [employer]  Tobacco Use  . Smoking status: Never Smoker    . Smokeless tobacco: Never Used  Vaping Use  . Vaping Use: Never used  Substance and Sexual Activity  . Alcohol use: Yes    Alcohol/week: 7.0 standard drinks    Types: 2 Glasses of wine, 5 Cans of beer per week    Comment:  1-2 daily  . Drug use: Never  . Sexual activity: Yes    Partners: Female    Birth control/protection: None  Other Topics Concern  . Not on file  Social History Narrative  . Not on file   Social Determinants of Health   Financial Resource Strain:   . Difficulty of Paying Living Expenses: Not on file  Food Insecurity:   . Worried About Charity fundraiser in the Last Year: Not on file  . Ran Out of Food in the Last Year: Not on file  Transportation Needs:   . Lack of Transportation (Medical): Not on file  . Lack of Transportation (Non-Medical): Not on file  Physical Activity:   . Days of Exercise per Week: Not on file  . Minutes of Exercise per Session: Not on file  Stress:   . Feeling of Stress : Not on file  Social Connections:   . Frequency of Communication with Friends and Family: Not on file  . Frequency of Social Gatherings with Friends and Family: Not on file  . Attends Religious Services: Not on file  . Active Member of Clubs or Organizations: Not on file  . Attends Archivist Meetings: Not on file  . Marital Status: Not on file  Intimate Partner Violence:   . Fear of Current or Ex-Partner: Not on file  . Emotionally Abused: Not on file  . Physically Abused: Not on file  . Sexually Abused: Not on file    Family History  Problem Relation Age of Onset  . Colon cancer Mother 6  . Alzheimer's disease Father 47  . Colon cancer Sister 21  . CAD Brother     ROS: no fevers or chills, productive cough, hemoptysis, dysphasia, odynophagia, melena, hematochezia, dysuria, hematuria, rash, seizure activity, orthopnea, PND, pedal edema, claudication. Remaining systems are negative.  Physical Exam: Well-developed well-nourished in no  acute distress.  Skin is warm and dry.  HEENT is normal.  Neck is supple.  Chest is clear to auscultation with normal expansion.  Cardiovascular exam is regular rate and rhythm.  Abdominal exam nontender or distended. No masses palpated. Extremities show no edema. neuro grossly intact  ECG-normal sinus rhythm at a rate of 61, no ST changes.  Personally reviewed  A/P  1 chest pain-electrocardiogram shows no ST changes.  Symptoms are atypical and he has had these intermittently for years.  Previous CTA showed moderate coronary artery disease in the right coronary and LAD.  However FFR was negative.  He does not have exertional chest pain.  We will check a troponin and if normal not pursue further evaluation.  2 presyncopal episode-episode sounds likely to be related to dehydration with possible vagal component.  He did not have frank syncope.  We will arrange an echocardiogram to assess LV function and otherwise follow expectantly.  We discussed the importance of maintaining hydration.  3 coronary artery disease-continue aspirin and statin.  4 hyperlipidemia-continue Lipitor and Zetia.  Check lipids and liver.  5 previous carotid endarterectomy-continue aspirin and statin.  Followed by Dr. Donnetta Hutching.  6 peripheral vascular disease  7 hypertension-blood pressure controlled.  Continue present medications and follow.  Check potassium and renal function.  Kirk Ruths, MD

## 2020-04-06 NOTE — Patient Instructions (Signed)
  Lab Work:  Your physician recommends that you HAVE LAB WORK TODAY  If you have labs (blood work) drawn today and your tests are completely normal, you will receive your results only by: MyChart Message (if you have MyChart) OR A paper copy in the mail If you have any lab test that is abnormal or we need to change your treatment, we will call you to review the results.   Follow-Up: At [facility], you and your health needs are our priority.  As part of our continuing mission to provide you with exceptional heart care, we have created designated Provider Care Teams.  These Care Teams include your primary Cardiologist (physician) and Advanced Practice Providers (APPs -  Physician Assistants and Nurse Practitioners) who all work together to provide you with the care you need, when you need it.  We recommend signing up for the patient portal called "MyChart".  Sign up information is provided on this After Visit Summary.  MyChart is used to connect with patients for Virtual Visits (Telemedicine).  Patients are able to view lab/test results, encounter notes, upcoming appointments, etc.  Non-urgent messages can be sent to your provider as well.   To learn more about what you can do with MyChart, go to [email or web address].    Your next appointment:   12 month(s)  The format for your next appointment:   In Person  Provider:   Brian Crenshaw, MD   

## 2020-04-07 LAB — LIPID PANEL
Chol/HDL Ratio: 1.9 ratio (ref 0.0–5.0)
Cholesterol, Total: 163 mg/dL (ref 100–199)
HDL: 87 mg/dL (ref >39)
LDL Chol Calc (NIH): 65 mg/dL (ref 0–99)
Triglycerides: 52 mg/dL (ref 0–149)
VLDL Cholesterol Cal: 11 mg/dL (ref 5–40)

## 2020-04-07 LAB — COMPREHENSIVE METABOLIC PANEL
ALT: 41 IU/L (ref 0–44)
AST: 40 IU/L (ref 0–40)
Albumin/Globulin Ratio: 2 (ref 1.2–2.2)
Albumin: 4.7 g/dL (ref 3.8–4.9)
Alkaline Phosphatase: 46 IU/L (ref 44–121)
BUN/Creatinine Ratio: 16 (ref 9–20)
BUN: 16 mg/dL (ref 6–24)
Bilirubin Total: 0.5 mg/dL (ref 0.0–1.2)
CO2: 28 mmol/L (ref 20–29)
Calcium: 9.7 mg/dL (ref 8.7–10.2)
Chloride: 99 mmol/L (ref 96–106)
Creatinine, Ser: 1.03 mg/dL (ref 0.76–1.27)
GFR calc Af Amer: 92 mL/min/{1.73_m2} (ref >59)
GFR calc non Af Amer: 80 mL/min/{1.73_m2} (ref >59)
Globulin, Total: 2.3 g/dL (ref 1.5–4.5)
Glucose: 72 mg/dL (ref 65–99)
Potassium: 4.7 mmol/L (ref 3.5–5.2)
Sodium: 139 mmol/L (ref 134–144)
Total Protein: 7 g/dL (ref 6.0–8.5)

## 2020-04-09 ENCOUNTER — Ambulatory Visit (HOSPITAL_COMMUNITY): Payer: 59 | Attending: Cardiology

## 2020-04-09 ENCOUNTER — Other Ambulatory Visit: Payer: Self-pay

## 2020-04-09 DIAGNOSIS — R072 Precordial pain: Secondary | ICD-10-CM | POA: Insufficient documentation

## 2020-04-09 LAB — ECHOCARDIOGRAM COMPLETE
Area-P 1/2: 3.65 cm2
S' Lateral: 3.2 cm

## 2020-04-10 ENCOUNTER — Encounter: Payer: Self-pay | Admitting: Cardiology

## 2020-04-10 ENCOUNTER — Encounter: Payer: Self-pay | Admitting: *Deleted

## 2020-04-10 NOTE — Progress Notes (Signed)
Patient contacted me and requested copy of most recent clinic note and report of echocardiogram performed yesterday.  He also requested a letter concerning my recent evaluation on October 22 and my opinion about any restrictions on his activities including surgery.  Kirk Ruths

## 2020-04-10 NOTE — Progress Notes (Signed)
     Dr Andria Frames,  This letter concerns Anthony Price, MD.  As you know he is a patient of mine at [facility].  He has a history hypertension and hyperlipidemia. He underwent carotid endarterectomy January 2020.  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  FFR suggested significant stenosis in a small D2 but not in the LAD or right coronary artery and he has been treated medically.  It should be noted that he has excellent functional capacity (able to cycle 15 miles with no dyspnea or chest pain).  He has never had a syncopal episode.  I evaluated Dr. Roxy Manns on April 06, 2021 following an episode of dizziness in the operating room.  Based on the history I obtained he had a small breakfast that morning but no other p.o. intake the rest of the morning or afternoon.  He had a particularly stressful case in the operating room with a critically ill patient.  His case started approximately 8:15 AM.  At approximately 2:15 PM with the patient doing very poorly he developed sudden onset of dizziness.  There was no associated chest pain, dyspnea, palpitations, nausea or diaphoresis.  He did not have syncope.  He was helped to a seat and his symptoms resolved after approximately 30 seconds.  He was given 1 L of IV fluid.  He then resumed the procedure which he completed 6 hours later with no further symptoms.  Later in the evening he had vague discomfort in the left chest that he has had intermittently for years and associates with stress.  He contacted me the following morning for an evaluation.  At the time of my evaluation he was completely asymptomatic with normal vital signs.  His physical exam was normal.  His electrocardiogram showed normal sinus rhythm with no ST changes.  A follow-up troponin was normal (7).  His potassium was 4.7, BUN 16 and creatinine 1.03.  A follow-up echocardiogram  showed normal LV and RV function; no wall motion abnormalities noted.  He was noted to have mild bileaflet mitral valve prolapse with mild to moderate mitral regurgitation.  Based on all available data I believe his event in the OR was likely the result of a vagal episode exacerbated by decreased p.o. intake/mild dehydration.  He did not have syncope.  I do not believe that he requires further cardiac testing, particularly in light of his excellent functional capacity with no symptoms.  I think it is extremely unlikely that he will have recurrent episodes in the future.  From a cardiac perspective he may resume all activities including surgical privileges without restriction.  Sincerely, Denice Bors. Stanford Breed, MD

## 2020-04-12 ENCOUNTER — Other Ambulatory Visit: Payer: Self-pay | Admitting: *Deleted

## 2020-04-12 ENCOUNTER — Ambulatory Visit (HOSPITAL_COMMUNITY)
Admission: RE | Admit: 2020-04-12 | Discharge: 2020-04-12 | Disposition: A | Discharge status: Home / Self Care | Payer: 59 | Source: Ambulatory Visit | Attending: Vascular Surgery | Admitting: Vascular Surgery

## 2020-04-12 ENCOUNTER — Other Ambulatory Visit: Payer: Self-pay

## 2020-04-12 DIAGNOSIS — I6521 Occlusion and stenosis of right carotid artery: Secondary | ICD-10-CM

## 2020-04-13 DIAGNOSIS — R55 Syncope and collapse: Secondary | ICD-10-CM | POA: Diagnosis not present

## 2020-04-18 DIAGNOSIS — Z23 Encounter for immunization: Secondary | ICD-10-CM | POA: Diagnosis not present

## 2020-04-26 DIAGNOSIS — R55 Syncope and collapse: Secondary | ICD-10-CM | POA: Diagnosis not present

## 2020-05-04 ENCOUNTER — Other Ambulatory Visit: Payer: Self-pay | Admitting: Cardiology

## 2020-05-04 DIAGNOSIS — E7800 Pure hypercholesterolemia, unspecified: Secondary | ICD-10-CM

## 2020-06-25 DIAGNOSIS — Z0000 Encounter for general adult medical examination without abnormal findings: Secondary | ICD-10-CM | POA: Diagnosis not present

## 2020-06-25 DIAGNOSIS — E7800 Pure hypercholesterolemia, unspecified: Secondary | ICD-10-CM | POA: Diagnosis not present

## 2020-06-25 DIAGNOSIS — N529 Male erectile dysfunction, unspecified: Secondary | ICD-10-CM | POA: Diagnosis not present

## 2020-06-25 DIAGNOSIS — I2510 Atherosclerotic heart disease of native coronary artery without angina pectoris: Secondary | ICD-10-CM | POA: Diagnosis not present

## 2020-06-25 DIAGNOSIS — K219 Gastro-esophageal reflux disease without esophagitis: Secondary | ICD-10-CM | POA: Diagnosis not present

## 2020-06-25 DIAGNOSIS — I6521 Occlusion and stenosis of right carotid artery: Secondary | ICD-10-CM | POA: Diagnosis not present

## 2020-06-25 DIAGNOSIS — N400 Enlarged prostate without lower urinary tract symptoms: Secondary | ICD-10-CM | POA: Diagnosis not present

## 2020-06-25 DIAGNOSIS — I10 Essential (primary) hypertension: Secondary | ICD-10-CM | POA: Diagnosis not present

## 2020-07-19 ENCOUNTER — Other Ambulatory Visit (HOSPITAL_COMMUNITY): Payer: Self-pay | Admitting: Internal Medicine

## 2020-09-06 ENCOUNTER — Encounter: Payer: Self-pay | Admitting: Cardiology

## 2020-09-07 ENCOUNTER — Encounter: Payer: Self-pay | Admitting: *Deleted

## 2020-09-19 ENCOUNTER — Other Ambulatory Visit (HOSPITAL_COMMUNITY): Payer: Self-pay

## 2020-09-19 MED ORDER — TADALAFIL 5 MG PO TABS
ORAL_TABLET | ORAL | 1 refills | Status: DC
  Filled 2020-09-19: qty 90, 90d supply, fill #0
  Filled 2020-12-26: qty 30, 30d supply, fill #1
  Filled 2021-01-22: qty 30, 30d supply, fill #2
  Filled 2021-02-20: qty 30, 30d supply, fill #3

## 2020-09-19 MED ORDER — AMLODIPINE BESYLATE 5 MG PO TABS
5.0000 mg | ORAL_TABLET | Freq: Every day | ORAL | 3 refills | Status: DC
  Filled 2020-09-19 – 2020-11-09 (×3): qty 90, 90d supply, fill #0

## 2020-11-09 ENCOUNTER — Other Ambulatory Visit (HOSPITAL_COMMUNITY): Payer: Self-pay

## 2020-11-09 MED ORDER — ROSUVASTATIN CALCIUM 40 MG PO TABS
40.0000 mg | ORAL_TABLET | Freq: Every day | ORAL | 3 refills | Status: DC
  Filled 2020-11-09: qty 90, 90d supply, fill #0
  Filled 2021-01-22: qty 90, 90d supply, fill #1
  Filled 2021-04-03: qty 90, 90d supply, fill #2

## 2020-11-09 MED FILL — Amlodipine Besylate Tab 5 MG (Base Equivalent): ORAL | 90 days supply | Qty: 90 | Fill #0 | Status: CN

## 2020-11-09 MED FILL — Lisinopril Tab 20 MG: ORAL | 90 days supply | Qty: 90 | Fill #0 | Status: AC

## 2020-11-09 MED FILL — Ezetimibe Tab 10 MG: ORAL | 90 days supply | Qty: 90 | Fill #0 | Status: AC

## 2020-11-09 MED FILL — Pantoprazole Sodium EC Tab 40 MG (Base Equiv): ORAL | 90 days supply | Qty: 90 | Fill #0 | Status: AC

## 2020-11-13 ENCOUNTER — Other Ambulatory Visit (HOSPITAL_COMMUNITY): Payer: Self-pay

## 2020-11-15 ENCOUNTER — Other Ambulatory Visit (HOSPITAL_COMMUNITY): Payer: Self-pay

## 2020-11-15 MED ORDER — CARESTART COVID-19 HOME TEST VI KIT
PACK | 0 refills | Status: AC
  Filled 2020-11-15: qty 4, 4d supply, fill #0

## 2020-12-20 DIAGNOSIS — H524 Presbyopia: Secondary | ICD-10-CM | POA: Diagnosis not present

## 2020-12-20 DIAGNOSIS — H52223 Regular astigmatism, bilateral: Secondary | ICD-10-CM | POA: Diagnosis not present

## 2020-12-26 ENCOUNTER — Other Ambulatory Visit (HOSPITAL_COMMUNITY): Payer: Self-pay

## 2021-01-22 ENCOUNTER — Other Ambulatory Visit (HOSPITAL_COMMUNITY): Payer: Self-pay

## 2021-01-22 MED FILL — Pantoprazole Sodium EC Tab 40 MG (Base Equiv): ORAL | 90 days supply | Qty: 90 | Fill #1 | Status: AC

## 2021-01-22 MED FILL — Lisinopril Tab 20 MG: ORAL | 90 days supply | Qty: 90 | Fill #1 | Status: AC

## 2021-01-22 MED FILL — Ezetimibe Tab 10 MG: ORAL | 90 days supply | Qty: 90 | Fill #1 | Status: AC

## 2021-02-20 ENCOUNTER — Other Ambulatory Visit (HOSPITAL_COMMUNITY): Payer: Self-pay

## 2021-02-20 MED ORDER — TADALAFIL 5 MG PO TABS
5.0000 mg | ORAL_TABLET | Freq: Every day | ORAL | 1 refills | Status: AC | PRN
  Filled 2021-02-20: qty 90, 90d supply, fill #0

## 2021-04-03 ENCOUNTER — Other Ambulatory Visit: Payer: Self-pay | Admitting: Cardiology

## 2021-04-03 ENCOUNTER — Other Ambulatory Visit: Payer: Self-pay

## 2021-04-03 ENCOUNTER — Other Ambulatory Visit (HOSPITAL_COMMUNITY): Payer: Self-pay

## 2021-04-03 DIAGNOSIS — E7800 Pure hypercholesterolemia, unspecified: Secondary | ICD-10-CM

## 2021-04-03 MED ORDER — EZETIMIBE 10 MG PO TABS
ORAL_TABLET | Freq: Every day | ORAL | 0 refills | Status: DC
  Filled 2021-04-03: qty 30, 30d supply, fill #0

## 2021-04-03 MED FILL — Lisinopril Tab 20 MG: ORAL | 90 days supply | Qty: 90 | Fill #2 | Status: CN

## 2021-04-04 ENCOUNTER — Other Ambulatory Visit (HOSPITAL_COMMUNITY): Payer: Self-pay

## 2021-04-08 ENCOUNTER — Other Ambulatory Visit (HOSPITAL_COMMUNITY): Payer: Self-pay

## 2021-04-09 ENCOUNTER — Other Ambulatory Visit (HOSPITAL_COMMUNITY): Payer: Self-pay

## 2021-04-09 MED ORDER — PANTOPRAZOLE SODIUM 40 MG PO TBEC
40.0000 mg | DELAYED_RELEASE_TABLET | Freq: Every day | ORAL | 3 refills | Status: AC
  Filled 2021-04-09: qty 90, 90d supply, fill #0

## 2021-04-10 ENCOUNTER — Other Ambulatory Visit (HOSPITAL_COMMUNITY): Payer: Self-pay

## 2021-04-17 ENCOUNTER — Other Ambulatory Visit: Payer: Self-pay | Admitting: *Deleted

## 2021-04-17 ENCOUNTER — Other Ambulatory Visit (HOSPITAL_COMMUNITY): Payer: Self-pay

## 2021-04-17 DIAGNOSIS — E7800 Pure hypercholesterolemia, unspecified: Secondary | ICD-10-CM

## 2021-04-17 MED ORDER — LISINOPRIL 20 MG PO TABS
20.0000 mg | ORAL_TABLET | Freq: Every day | ORAL | 3 refills | Status: AC
  Filled 2021-04-17: qty 90, 90d supply, fill #0

## 2021-04-17 MED ORDER — AMLODIPINE BESYLATE 5 MG PO TABS
5.0000 mg | ORAL_TABLET | Freq: Every day | ORAL | 3 refills | Status: AC
  Filled 2021-04-17: qty 90, 90d supply, fill #0

## 2021-04-17 MED ORDER — ROSUVASTATIN CALCIUM 40 MG PO TABS
40.0000 mg | ORAL_TABLET | Freq: Every day | ORAL | 3 refills | Status: AC
  Filled 2021-04-17: qty 90, 90d supply, fill #0

## 2021-04-17 MED ORDER — EZETIMIBE 10 MG PO TABS
10.0000 mg | ORAL_TABLET | Freq: Every day | ORAL | 3 refills | Status: AC
  Filled 2021-04-17: qty 90, 90d supply, fill #0

## 2021-09-13 IMAGING — MR MR LUMBAR SPINE W/O CM
4 of 5 series · 19 of 48 positions shown · non-contrast
Comparison: None.

CLINICAL DATA: Low back pain. Left leg weakness.

EXAM:
MRI LUMBAR SPINE WITHOUT CONTRAST
TECHNIQUE: Multiplanar, multisequence MR imaging of the lumbar spine was
performed. No intravenous contrast was administered.

[Series 3: T2 post-contrast · sagittal · 4.0mm · 0.51mm/px · 6 of 16 slices shown]
[im 1/16]
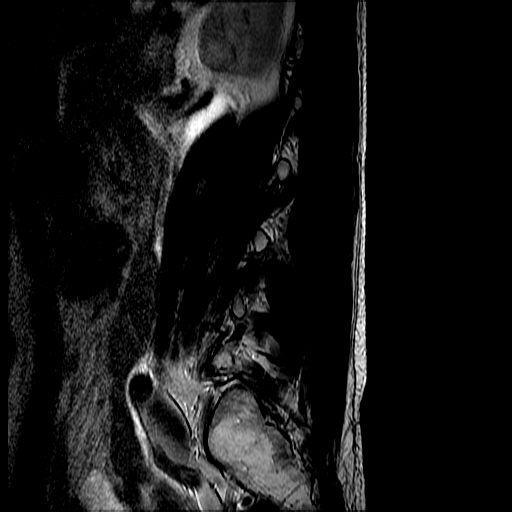
[im 4/16]
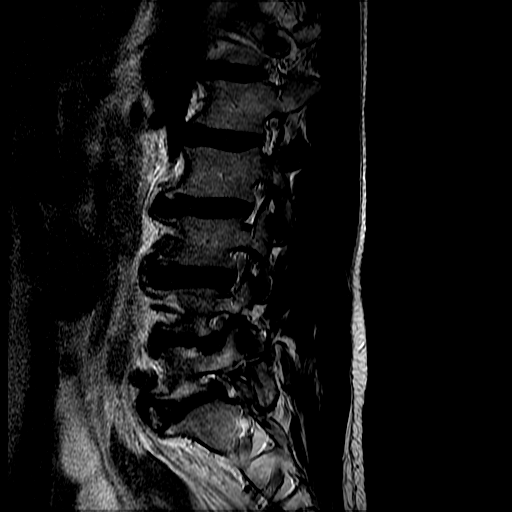
[im 7/16]
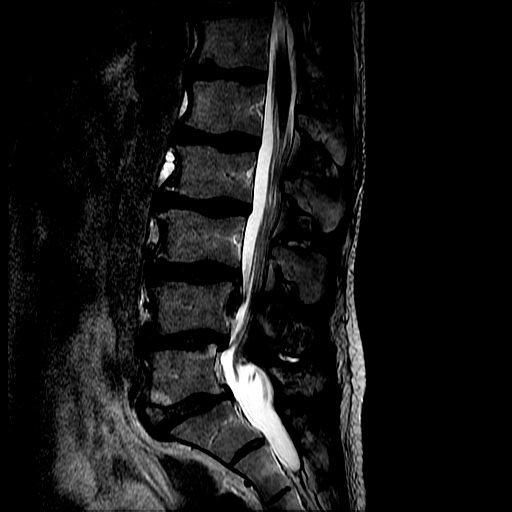
[im 10/16]
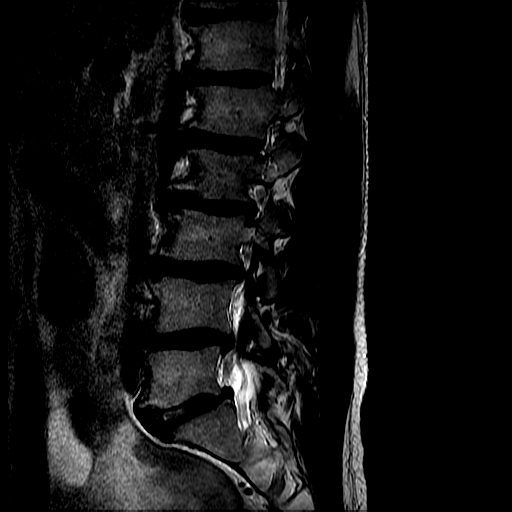
[im 13/16]
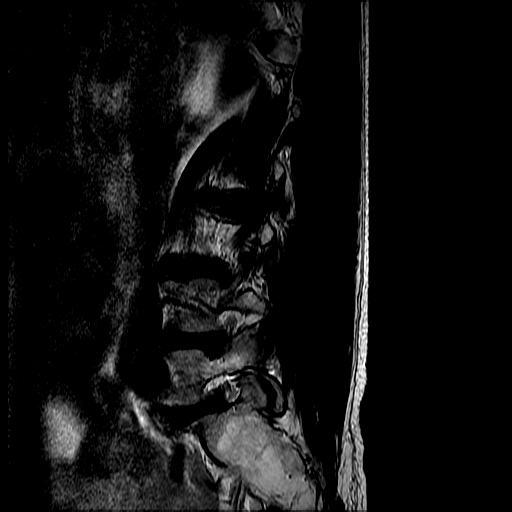
[im 16/16]
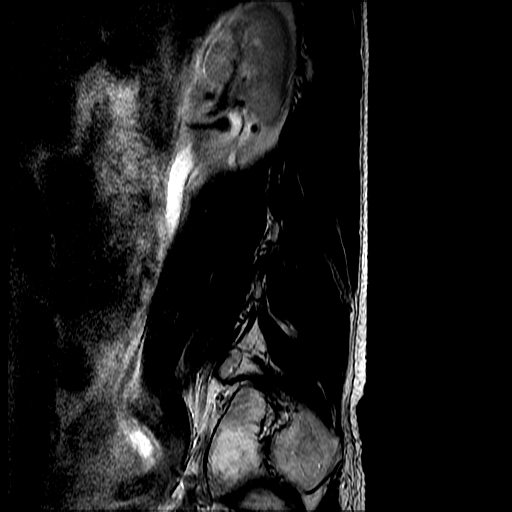

[Series 4: T1 · sagittal · 4.0mm · 0.51mm/px · 3 of 16 slices shown (1 of 2)]
[im 4/16]
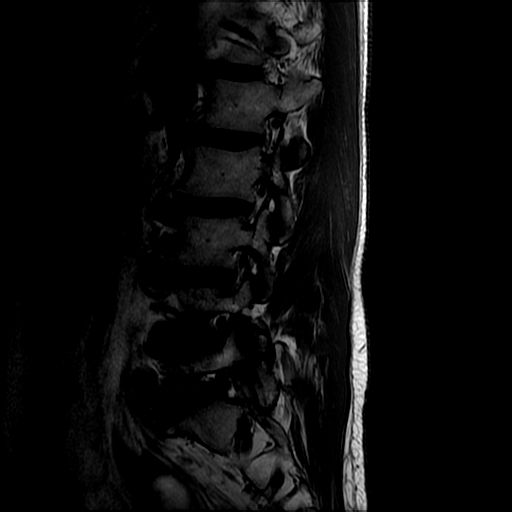
[im 10/16]
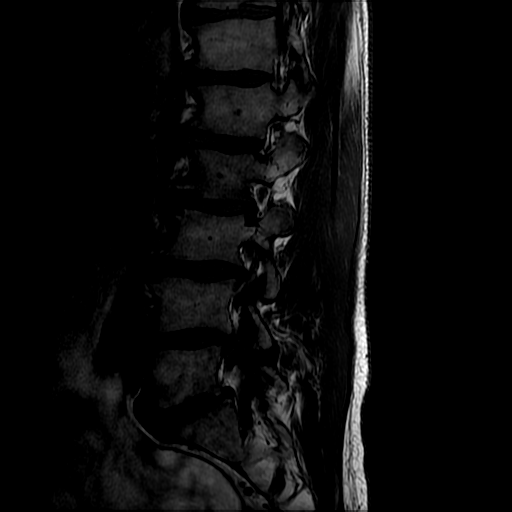
[im 16/16]
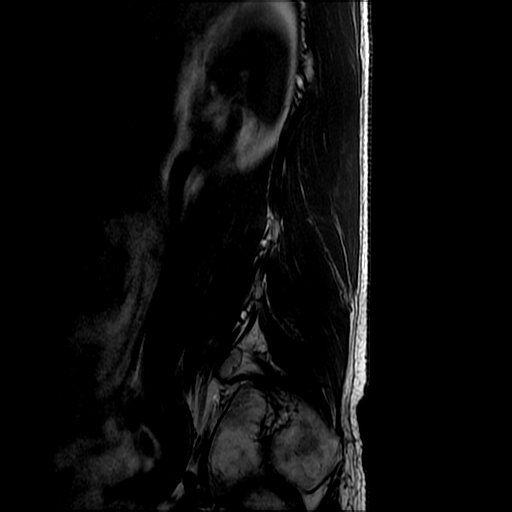

[Series 6: T2 · axial · 5.0mm · 0.39mm/px · z∈[-90,+113]mm · 7 of 42 slices shown]
[im 1/42]
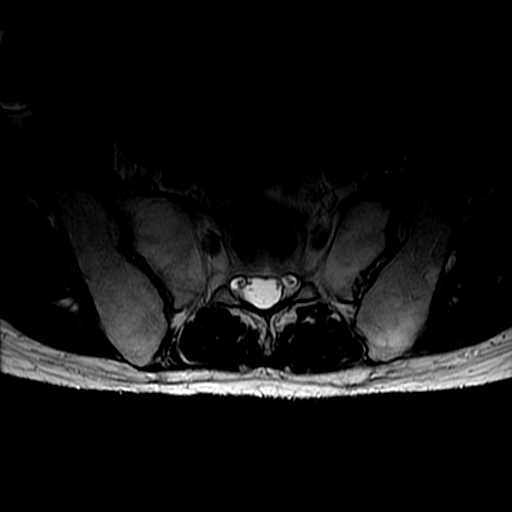
[im 6/42]
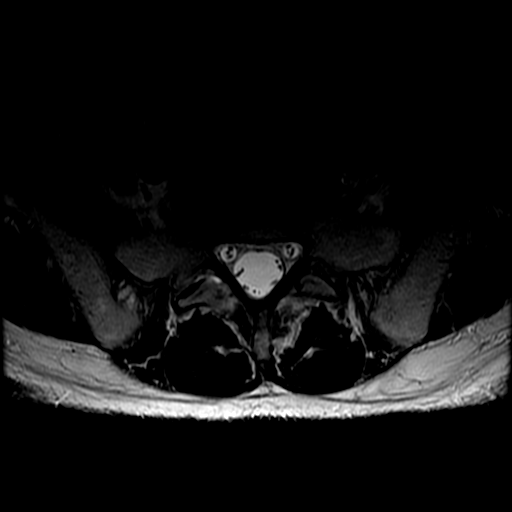
[im 12/42]
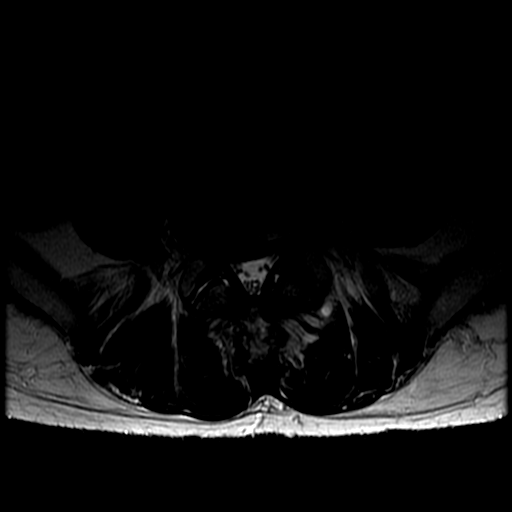
[im 18/42]
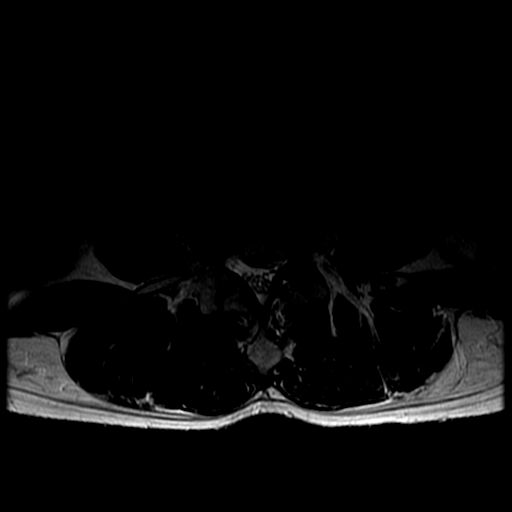
[im 21/42]
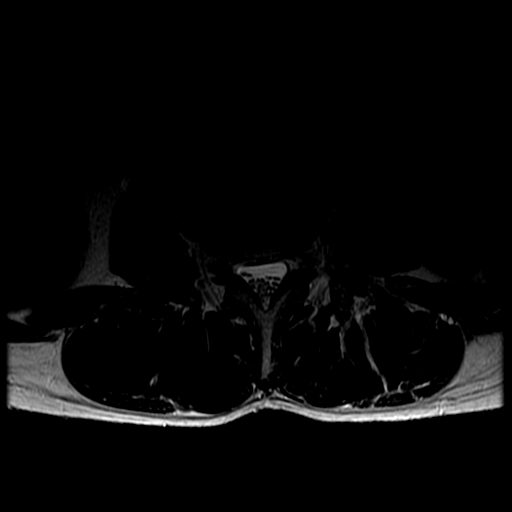
[im 24/42]
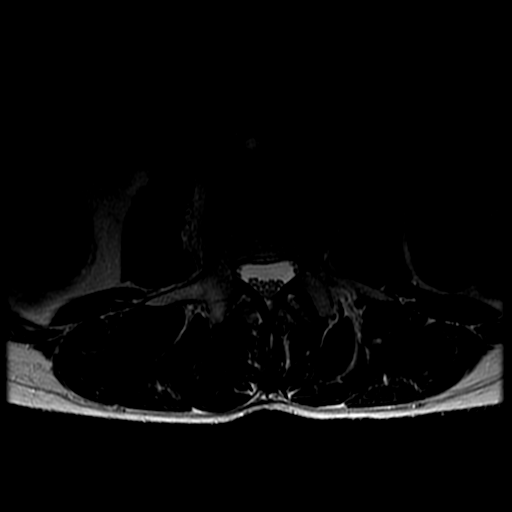
[im 36/42]
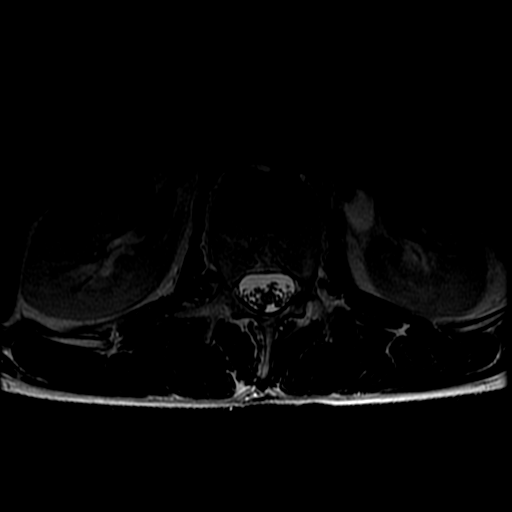

[Series 7: T1 · axial · 5.0mm · 0.39mm/px · z∈[-66,+113]mm · 3 of 42 slices shown (2 of 2)]
[im 6/42]
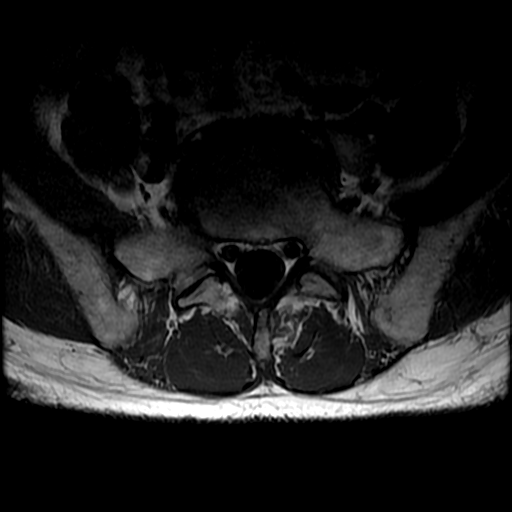
[im 21/42]
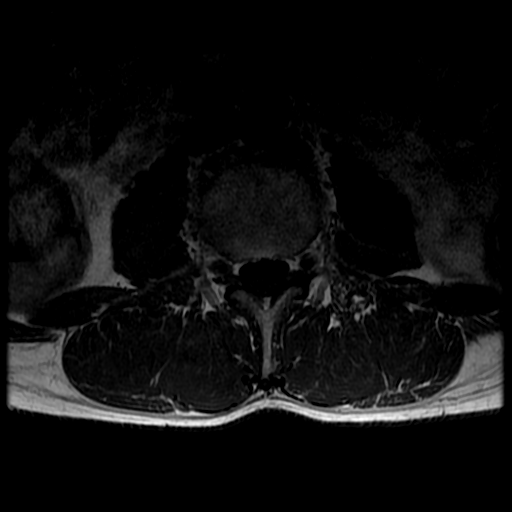
[im 36/42]
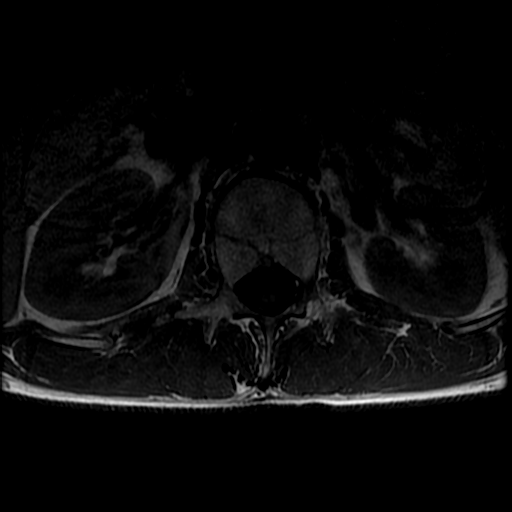

[19 of 48 positions shown; findings below may reference images not displayed]

FINDINGS: Segmentation:  Normal

Alignment: Mild retrolisthesis L1-2, L2-3, L3-4. 6 mm retrolisthesis
L4-5. Slight anterolisthesis L5-S1 with bilateral pars defects of
L5.

Vertebrae: Negative for vertebral fracture or mass. Normal bone
marrow.

Conus medullaris and cauda equina: Conus extends to the L1-2 level.
Conus and cauda equina appear normal.

Paraspinal and other soft tissues: Negative for paraspinous mass or
adenopathy.

Disc levels:

L1-2: Mild disc degeneration. Negative for stenosis

L2-3: Mild retrolisthesis with mild disc degeneration and disc
bulging. Negative for stenosis

L3-4: Extruded disc fragment on the left with downgoing disc
material. Moderately large disc fragment with impingement of the
left L4 nerve root. Mild facet degeneration. Mild spinal stenosis.

L4-5: 6 mm retrolisthesis. Disc degeneration with disc bulging and
endplate spurring. Endplate edema on the left with associated
Schmorl's mode. Bilateral facet hypertrophy. Moderate subarticular
foraminal stenosis on the right. Severe subarticular and foraminal
stenosis on the left.

L5-S1: Mild anterolisthesis with bilateral pars defects of L5.
Impingement of the left L5 nerve root in the foramen.
IMPRESSION: 1. Extruded disc fragment on the left at L3-4 with downgoing disc
material. Moderately large disc fragment with impingement of the
left L4 nerve root. Mild spinal stenosis.
2. 6 mm retrolisthesis L4-5 with severe subarticular and foraminal
stenosis on the left.
3. Mild anterolisthesis L5-S1 with bilateral pars defects of L5.
Impingement of the left L5 nerve root in the foramen.
